# Patient Record
Sex: Male | Born: 1970 | Race: White | Hispanic: No | Marital: Married | Smoking: Current every day smoker
Health system: Southern US, Community
[De-identification: ages and names within clinical notes are randomized; demographics above are authoritative.]

## PROBLEM LIST (undated history)

## (undated) DIAGNOSIS — K219 Gastro-esophageal reflux disease without esophagitis: Secondary | ICD-10-CM

## (undated) DIAGNOSIS — E785 Hyperlipidemia, unspecified: Secondary | ICD-10-CM

## (undated) DIAGNOSIS — G249 Dystonia, unspecified: Secondary | ICD-10-CM

## (undated) DIAGNOSIS — F32A Depression, unspecified: Secondary | ICD-10-CM

## (undated) DIAGNOSIS — E781 Pure hyperglyceridemia: Secondary | ICD-10-CM

## (undated) HISTORY — DX: Dystonia, unspecified: G24.9

## (undated) HISTORY — DX: Pure hyperglyceridemia: E78.1

## (undated) HISTORY — PX: OTHER SURGICAL HISTORY: SHX169

## (undated) HISTORY — DX: Gastro-esophageal reflux disease without esophagitis: K21.9

## (undated) HISTORY — DX: Hyperlipidemia, unspecified: E78.5

## (undated) HISTORY — DX: Depression, unspecified: F32.A

---

## 2001-03-17 ENCOUNTER — Ambulatory Visit (HOSPITAL_COMMUNITY): Admission: RE | Admit: 2001-03-17 | Discharge: 2001-03-17 | Payer: Self-pay | Admitting: Orthopaedic Surgery

## 2001-03-17 HISTORY — PX: KNEE CARTILAGE SURGERY: SHX688

## 2005-02-06 ENCOUNTER — Emergency Department (HOSPITAL_COMMUNITY): Admission: EM | Admit: 2005-02-06 | Discharge: 2005-02-07 | Payer: Self-pay | Admitting: Emergency Medicine

## 2005-05-20 ENCOUNTER — Ambulatory Visit: Payer: Self-pay | Admitting: Cardiology

## 2005-12-17 ENCOUNTER — Ambulatory Visit (HOSPITAL_BASED_OUTPATIENT_CLINIC_OR_DEPARTMENT_OTHER): Admission: RE | Admit: 2005-12-17 | Discharge: 2005-12-17 | Payer: Self-pay | Admitting: Orthopedic Surgery

## 2008-12-20 ENCOUNTER — Ambulatory Visit (HOSPITAL_BASED_OUTPATIENT_CLINIC_OR_DEPARTMENT_OTHER): Admission: RE | Admit: 2008-12-20 | Discharge: 2008-12-20 | Payer: Self-pay | Admitting: Orthopedic Surgery

## 2009-09-27 ENCOUNTER — Ambulatory Visit (HOSPITAL_BASED_OUTPATIENT_CLINIC_OR_DEPARTMENT_OTHER): Admission: RE | Admit: 2009-09-27 | Discharge: 2009-09-28 | Payer: Self-pay | Admitting: Orthopedic Surgery

## 2010-07-23 LAB — POCT HEMOGLOBIN-HEMACUE: Hemoglobin: 15.1 g/dL (ref 13.0–17.0)

## 2010-08-11 LAB — POCT HEMOGLOBIN-HEMACUE: Hemoglobin: 15.5 g/dL (ref 13.0–17.0)

## 2010-09-18 NOTE — Op Note (Signed)
NAME:  Jonathan Meyer, Jonathan Meyer             ACCOUNT NO.:  [id number]   MEDICAL RECORD NO.:  [id number]          PATIENT TYPE:  AMB   LOCATION:  DSC                          FACILITY:  [facility]   PHYSICIAN:  Cindee Salt, M.D.       DATE OF BIRTH:  [birth year]   DATE OF PROCEDURE:  12/20/2008  DATE OF DISCHARGE:                               OPERATIVE REPORT   PREOPERATIVE DIAGNOSIS:  Ulnocarpal abutment, right wrist.   POSTOPERATIVE DIAGNOSIS:  Ulnocarpal abutment, right wrist.   OPERATION:  Ulnar shortening osteotomy, right wrist.   SURGEON:  Cindee Salt, MD   ASSISTANTCarolyne Fiscal, RN   ANESTHESIA:  General with local infiltration.   ANESTHESIOLOGIST:  Burna Forts, MD   HISTORY:  The patient is a 40 year old male with a history of ulnocarpal  abutment.  He has undergone an arthroscopic Gibson Ramp arthroplasty, but  has had continued pain on the ulnar aspect of his wrist.  He has elected  to proceed with ulnar shortening osteotomy.  He is aware of risks and  complications including infection; recurrence of injury to arteries,  nerves, tendons; incomplete relief of symptoms; dystrophy, the  possibility of nonunion, a further surgery being necessary to afford  healing.  He is scheduled for ulnar shortening osteotomy with Infuse  bone graft.  In preoperative area, the patient is seen.  The extremity  marked by both the patient and surgeon.  Antibiotic given.  Questions  again encouraged and answered.   PROCEDURE:  The patient was brought to the operating room where a  general anesthetic was carried out without difficulty was prepped using  ChloraPrep, supine position, right arm free.  A 3-minute dry time was  allowed, a time-out taken confirming the patient procedure.  The arm was  then draped.  The limb was exsanguinated with an Esmarch bandage.  Tourniquet was placed on the high and the arm was inflated to 250 mmHg.  A longitudinal incision was made on the ulnar aspect of the wrist,  carried down through the subcutaneous tissue.  Bleeders were  electrocauterized.  The interval between the extensor carpi ulnaris and  extensor digiti quinti was opened followed down to the distal ulna.  With blunt and sharp dissection, bleeders were electrocauterized with  bipolar.  An incision was made in the periosteum.  The periosteum  elevated.  A 7-hole modular 2.4-mm plate was then selected.  This was  placed distally with two 10 and one 11-mm screws.  The screws were then  removed, the plate rotated.  The ulna marked with an osteotome for  rotational alignment and along with marks for the plate and marks for  the osteotomy site and an oblique osteotomy was made, this removed  approximately 4 mm of bone.  The plate was then rotated.  The osteotomy  was made with an oscillating saw taking care to avoid burning the bone  with constant irrigation.  The segment of bone was removed without  difficulties.  Retractors were placed protecting the muscle and ulnar  artery and nerve.  These were removed.  The plate was rotated and  reapplied.  A 62 K-wire was then placed in the proximal hole.  This  allowed the plate to be compressed with a bone clamp through most  proximal to the osteotomy site, was then drilled.  This was done with  1.8 mm drill.  A 12-mm screw was then inserted, this fixed the site of  it did not fully compress the osteotomy.  An oblique screw was then  passed, this measured 14 mm.  This was overdrilled on the proximal  cortex.  This was then inserted.  The most proximal screw was backed out  to relieve any tension on the plate and the osteotomy site fully  compressed.  The most proximal screw was then reinserted, tightened.  Two further screws were placed proximally both measuring 12 mm.  The  wound was copiously irrigated with saline.  X-rays confirmed positioning  of the osteotomy site with shortening of the ulna.  Full pronation and  supination was afforded to the  patient.  A 0.7 mL Infuse bone graft had  been soaked.  This was then placed dorsally and palmarly on the  osteotomy site.  The fascia was then closed with a running 2-0 Vicryl  suture, the subcutaneous tissue with interrupted 2-0 Vicryl and the skin  with interrupted 4-0 Vicryl Rapide sutures.  A sterile compressive  dressing and long-arm splint was applied.  The patient tolerated the  procedure well.  On deflation of the tourniquet, all fingers were  immediately pinked.  He was taken to the recovery room for observation  in satisfactory condition.  He will be discharged home to return to the  [facility] in 1 week on Percocet.  He will be admitted  for overnight stay, will be discharged if he is comfortable.  A local  infiltration was given with 0.25% Marcaine without epinephrine in the  wound, 6 mL was used.  He will return to the office in 1 week on  Percocet.           ______________________________  Cindee Salt, M.D.     GK/MEDQ  D:  12/20/2008  T:  12/20/2008  Job:  [id number]

## 2010-09-21 NOTE — H&P (Signed)
[facility]  Patient:    Jonathan Meyer, Jonathan Meyer Visit Number: [id number] MRN: [id number]          Service Type: Attending:  Darreld Mclean, M.D. Dictated by:   Darreld Mclean, M.D. Adm. Date:  03/09/01                           History and Physical  CHIEF COMPLAINT:  Right knee pain.  The patient is scheduled for surgery on November 12.  HISTORY OF PRESENT ILLNESS:  The patient is a 40 year old white male with pain and tenderness in his right knee.  It has been bothering him on and off for two years.  It got progressively worse, particularly around the first of the month.  He has had an MRI of the knee performed.  This was done on the 22nd as ordered by Dr. Johny Shears in [place].  The MRI showed a tear of the medial meniscus of the posterior horn and body of the medial meniscus.  There was also fluid within the knee.  Dr. Jefferey Pica sent the patient to the office.  I saw him the first time on the 28th.  Surgery was recommended.  Due to the patients work schedule and other concerns, surgery was set up for November 12 at [facility].  REVIEW OF SYSTEMS:  The patient has negative review of systems.  ALLERGIES:  Denies any allergies.  MEDICATIONS:  Currently on Vicodin and he also has a nebulizer for bronchitis.  SOCIAL HISTORY:  He smokes two packs of cigarettes per day.  Does not use alcoholic beverages.  He has a GED.  Never has had any surgery.  The patient is married and works at [employer].  FAMILY HISTORY:  Heart problems and cancer runs in the family.  He has had none of these.  PHYSICAL EXAMINATION:  VITAL SIGNS:  The patient is afebrile, pulse 64, respirations 16, blood pressure 118/86, height 5 feet 8-1/2 inches, weight 165.  HEENT:  Negative.  NECK:  Supple.  LUNGS:  Clear to percussion and auscultation.  HEART:  Regular rhythm without murmur heart.  ABDOMEN:  Soft, nontender without masses.  EXTREMITIES:  Right knee painful,  positive knee to McMurray.  Knee is stable. No effusion.  Range of motion is good.  Other extremities are normal.  CNS:  Intact.  IMPRESSION:  Torn medial meniscus, right knee.  PLAN:  Operative arthroscopy of the right knee.  The risks and _____ have been explained to the patient.  He appears to understand and agrees to them. Labs are pending. Dictated by:   Darreld Mclean, M.D. Attending:  Darreld Mclean, M.D. DD:  03/09/01 TD:  03/09/01 Job: [id number]

## 2010-09-21 NOTE — Op Note (Signed)
[facility]  Patient:    Jonathan Meyer, Jonathan Meyer Visit Number: [id number] MRN: [id number]          Service Type: DSU Location: DAY Attending Physician:  Windle Guard Dictated by:   Darreld Mclean, M.D. Proc. Date: 03/17/01 Admit Date:  03/17/2001                             Operative Report  PREOPERATIVE DIAGNOSIS:  Tear of medial meniscus, right knee.  POSTOPERATIVE DIAGNOSIS:  Tear of medial meniscus, right knee.  OPERATION:  Operative arthroscopy with partial medial meniscectomy, right knee.  SURGEON:  Darreld Mclean, M.D.  ASSISTANT:  Candace Cruise, P.A.-C.  ANESTHESIA:  General.  TOURNIQUET TIME:  25 minutes.  DRAINS:  None.  INDICATIONS:  A 40 year old white male who complains of tenderness in his right knee.  MRI shows tear of medial meniscus.  Conservative treatment has not been successful.  Risks and imponderables were discussed with the patient preoperatively, and he appears to understand and agreed to the procedure as outlined.  FINDINGS:  Suprapatellar pouch was normal, patella normal, medially surfaces looked normal, posterior horn and medial meniscus.  Anterior cruciate was intact.  Laterally, articular surfaces were normal.  Lateral meniscus was normal.  No loose bodies.  DESCRIPTION OF PROCEDURE:  The patient was given general anesthesia and placed supine on the operating room table.  Tourniquet and leg holder placed, deflated right upper thigh.  The patient was prepped and draped in the usual manner.  The leg was systematically prepared.  First a in-flow cannula was inserted medially.  Using infusion pump, lactated Ringers were instilled into the knee.  All scopes were inserted laterally.  Knee was systematically e examined.  Please see Findings above.  Permanent pictures were taken throughout the case.  Tear of the medial meniscus was identified, and using a meniscectomy shaver and a punch, this part of the meniscus was  removed.  The knee was rather tight.  We were able to remove a good portion of the meniscus that was torn and made a very smooth contour of the remaining portion.  The knee was systematically re-examined, and no pathology was found.  The wounds were reapproximated using 3-0 nylon in interrupted vertical mattress manner. Marcaine 0.25% instilled in each portal.  Tourniquet deflated after 25 minutes.  Sterile dressing applied.  Bulky dressing applied.  Sponge and needle count was correct.  Tolerated procedure well and went to recovery in good condition.  Appropriate analgesia given for pain.  I will see him in the office in approximately 10 days to two weeks.  Physical therapy has been arranged.  For any difficulties, contact me through the office or hospital beeper system. Dictated by:   Darreld Mclean, M.D. Attending Physician:  Windle Guard DD:  03/17/01 TD:  03/17/01 Job: [id number]

## 2010-09-21 NOTE — Op Note (Signed)
NAME:  Jonathan, Meyer NO.:  [id number]   MEDICAL RECORD NO.:  [id number]          PATIENT TYPE:  AMB   LOCATION:  DSC                          FACILITY:  [facility]   PHYSICIAN:  Cindee Salt, M.D.       DATE OF BIRTH:  07-30-1970   DATE OF PROCEDURE:  12/17/2005  DATE OF DISCHARGE:                                 OPERATIVE REPORT   PREOPERATIVE DIAGNOSIS:  Triangle fibrocartilage complex tear right wrist.   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   OPERATION:  Arthroscopy right wrist, debridement of triangle fibrocartilage  complex and lunotriquetral tear, and Felden arthroplasty, right wrist.   SURGEON:  Cindee Salt, M.D.   ASSISTANT:  Carolyne Fiscal R.N.   ANESTHESIA:  General.   HISTORY:  The patient is a 40 year old male with a history of ulnar sided  wrist pain following injury.  He has had an MRI done revealing a tear of the  triangle fibrocartilage complex.  He has elected to proceed with arthroscopy  debridement, possible Felden arthroplasty in that he shows a 1 mm on the  plus.  In the preoperative area, questions were encouraged and answered, the  surgery discussed, the patient marked the forearm along with the surgeon.   PROCEDURE:  The patient was brought to the operating room where general  anesthetic was carried out without difficulty.  He was prepped using  DuraPrep, supine position, right arm free.  After a three minute dry time,  he was draped.  The limb was placed in the arthroscopy tower and 10 pounds  traction applied.  The joint was inflated at the 3/4 portal. A portal was  opened, transverse incision through skin only, deepened with a hemostat,  blunt trocar was used enter the joint.  The EPL was protected. The joint was  inspected.  The volar ligaments were intact on the radial side.  The  scapholunate ligament was intact.  There were no articular changes across  the distal radius.  Instability was noted to the lunotriquetral joint.  A  triangle fibrocartilage dual tear was noted, one in the A1A position, one  parallel to this closer to the fovea. The irrigation catheter was placed in  6/2 with an 18 gauge needle.  A 6R portal was opened after localization with  a 22 gauge needle and a probe was introduced.  The tear was identified,  changes were present on the distal lunate. The head of the ulnar showed some  chondromalacic changes.  The scope was introduced into the 6/2 portal and  inspection performed.  A large flap was noted.  A lunotriquetral tear was  also noted.  The mid carpal joint was then inspected after opening a radial  mid carpal portal after localization with a 22 gauge needle. STT joint  showed no changes.  There were no articular changes along the capitate,  scaphoid lunate, no instability of the right scapholunate ligament,  instability was noted to the lunotriquetral joint, type 2 lunate was  present.  There were minimal changes of proximal aspect of the hamate.  The  scope was then reintroduced into the 3/4 portal.  An ArthroWand was  introduced into the 6R portal and a debridement of the triangle  fibrocartilage was then performed. The tear was eliminated and removed with  forceps. At this point in time, with some probing, a large flap of the  dorsal portion of the lunotriquetral joint ligament was noted and this was  noted to be an entire avulsion between the lunate triquetrum and the entire  ligament could be brought down into the joint.  This was then debrided with  the ArthroWand and with a suction punch along with a full radius shaver.  This eliminated both the entire free area. The synovitis was then removed  with a ArthroWand.  The ArthroWand was used while making certain that  adequate flow was present the entire time without any significant heating of  the outflow. A Gator shaver was then introduced and the cartilage removed  from the  distal ulna.  A bur was then introduced and the distal end of the  ulna was removed.  X-rays confirmed adequate removal to stabilize the distal  radioulnar joint without any further impingement. The instruments were  removed.  The portals were closed with interrupted 5-0 nylon sutures.  Sterile compressive dressing and dorsal palmar splint were applied to the  wrist.  The patient tolerated the procedure well and was taken to the  recovery room for observation in satisfactory condition.  He is discharged  home to return to the [facility] in one week on Vicodin.           ______________________________  Cindee Salt, M.D.     GK/MEDQ  D:  12/17/2005  T:  12/17/2005  Job:  [id number]

## 2010-10-05 ENCOUNTER — Ambulatory Visit (HOSPITAL_BASED_OUTPATIENT_CLINIC_OR_DEPARTMENT_OTHER)
Admission: RE | Admit: 2010-10-05 | Payer: Worker's Compensation | Source: Ambulatory Visit | Admitting: Orthopedic Surgery

## 2011-12-23 ENCOUNTER — Other Ambulatory Visit: Payer: Self-pay | Admitting: Occupational Medicine

## 2011-12-23 ENCOUNTER — Ambulatory Visit: Payer: Self-pay

## 2011-12-23 DIAGNOSIS — Z021 Encounter for pre-employment examination: Secondary | ICD-10-CM

## 2013-12-23 ENCOUNTER — Encounter: Payer: Self-pay | Admitting: Cardiology

## 2013-12-23 ENCOUNTER — Telehealth: Payer: Self-pay | Admitting: Cardiology

## 2013-12-23 ENCOUNTER — Encounter: Payer: Self-pay | Admitting: *Deleted

## 2013-12-23 ENCOUNTER — Ambulatory Visit (INDEPENDENT_AMBULATORY_CARE_PROVIDER_SITE_OTHER): Payer: BC Managed Care – PPO | Admitting: Cardiology

## 2013-12-23 VITALS — BP 118/84 | HR 69 | Ht 71.0 in | Wt 178.0 lb

## 2013-12-23 DIAGNOSIS — R0789 Other chest pain: Secondary | ICD-10-CM

## 2013-12-23 DIAGNOSIS — F17200 Nicotine dependence, unspecified, uncomplicated: Secondary | ICD-10-CM

## 2013-12-23 DIAGNOSIS — Z720 Tobacco use: Secondary | ICD-10-CM

## 2013-12-23 DIAGNOSIS — R079 Chest pain, unspecified: Secondary | ICD-10-CM

## 2013-12-23 MED ORDER — ASPIRIN EC 81 MG PO TBEC: 81.0000 mg | DELAYED_RELEASE_TABLET | Freq: Every day | ORAL | Status: DC

## 2013-12-23 MED ORDER — NITROGLYCERIN 0.4 MG SL SUBL: 0.4000 mg | SUBLINGUAL_TABLET | SUBLINGUAL | Status: DC | PRN

## 2013-12-23 NOTE — Patient Instructions (Signed)
   Begin Aspirin 81mg  daily  Begin Nitroglycerin as needed for severe chest pain only - new sent to pharm Continue all other medications.   Your physician has requested that you have a lexiscan myoview. For further information please visit [email or web address]. Please follow instruction sheet, as given. Office will contact with results via phone or letter.   Follow up pending test results

## 2013-12-23 NOTE — Progress Notes (Signed)
     Clinical Summary Jonathan Meyer is a 43 y.o.male seen today as a new patient for the following medical problems.   1. Chest pain - last Thursday episode while laying in bed. Stabbing like pain left chest, 9/10. Felt lightheaded, +SOB. Lasted approx 1 minute. Not positional, though maybe slightly worst with taking deep breath. Reports repeat episode yesterday while driving. Similar pain, but also with some numbness in left arm. This episode lasted < 1 min. No relation to eating - overall has been going on and off last few months. Increase in frequency, mild increase in severity. Occurs every few days - has noted increased DOE over the same time period. No LE, no orthopnea.   CAD: +tobacco, maternal uncle CABG in 5650s, maternal grandfather MI 2060s   Denies PMH  Allergies  Allergen Reactions  . Bee Venom Swelling     Current Outpatient Prescriptions  Medication Sig Dispense Refill  . ibuprofen (ADVIL,MOTRIN) 200 MG tablet Take 200 mg by mouth as needed for headache (migraines).       No current facility-administered medications for this visit.     No past surgical history on file.   Allergies  Allergen Reactions  . Bee Venom Swelling      No family history on file.   Social History Jonathan Meyer reports that he has been smoking Cigarettes.  He has been smoking about 0.50 packs per day. He does not have any smokeless tobacco history on file. Jonathan Meyer reports that he does not drink alcohol.   Review of Systems CONSTITUTIONAL: No weight loss, fever, chills, weakness or fatigue.  HEENT: Eyes: No visual loss, blurred vision, double vision or yellow sclerae.No hearing loss, sneezing, congestion, runny nose or sore throat.  SKIN: No rash or itching.  CARDIOVASCULAR: per HPI RESPIRATORY: No shortness of breath, cough or sputum.  GASTROINTESTINAL: No anorexia, nausea, vomiting or diarrhea. No abdominal pain or blood.  GENITOURINARY: No burning on urination, no  polyuria NEUROLOGICAL: No headache, dizziness, syncope, paralysis, ataxia, numbness or tingling in the extremities. No change in bowel or bladder control.  MUSCULOSKELETAL: No muscle, back pain, joint pain or stiffness.  LYMPHATICS: No enlarged nodes. No history of splenectomy.  PSYCHIATRIC: No history of depression or anxiety.  ENDOCRINOLOGIC: No reports of sweating, cold or heat intolerance. No polyuria or polydipsia.     Physical Examination Filed Vitals:   12/23/13 0932  BP: 118/84  Pulse: 69   Filed Weights   12/23/13 0932  Weight: 178 lb (80.74 kg)    Gen: resting comfortably, no acute distress HEENT: no scleral icterus, pupils equal round and reactive, no palptable cervical adenopathy,  CV Resp: Clear to auscultation bilaterally GI: abdomen is soft, non-tender, non-distended, normal bowel sounds, no hepatosplenomegaly MSK: extremities are warm, no edema.  Skin: warm, no rash Neuro:  no focal deficits Psych: appropriate affect   Diagnostic Studies EKG: sinus,RAD, no ischemic changes    Assessment and Plan  1. Chest pain - unclear etiology, mixed in characteristics - describes worsening DOE over the same time period.Symptoms are progressing in frequency and severity. Concern could be a cardiac cause - will obtain Lexiscan MPI, he is not able to run on treadmill due to chronic back pain - start ASA 81mg  daily, start SL NG prn for the time being   - f/u based on stress test results      Antoine PocheJonathan F. Dorice Stiggers, M.D., F.A.C.C.

## 2013-12-23 NOTE — Telephone Encounter (Signed)
lexiscan myoview for chest pain Aug 28th arrival at 8:00  Radiology

## 2013-12-31 ENCOUNTER — Encounter (HOSPITAL_COMMUNITY)
Admission: RE | Admit: 2013-12-31 | Discharge: 2013-12-31 | Disposition: A | Discharge status: Home / Self Care | Payer: BC Managed Care – PPO | Source: Ambulatory Visit | Attending: Cardiology | Admitting: Cardiology

## 2013-12-31 ENCOUNTER — Encounter (HOSPITAL_COMMUNITY): Payer: Self-pay

## 2013-12-31 ENCOUNTER — Ambulatory Visit (HOSPITAL_COMMUNITY)
Admission: RE | Admit: 2013-12-31 | Discharge: 2013-12-31 | Disposition: A | Discharge status: Home / Self Care | Payer: BC Managed Care – PPO | Source: Ambulatory Visit | Attending: Cardiology | Admitting: Cardiology

## 2013-12-31 DIAGNOSIS — Z87891 Personal history of nicotine dependence: Secondary | ICD-10-CM | POA: Insufficient documentation

## 2013-12-31 DIAGNOSIS — R0602 Shortness of breath: Secondary | ICD-10-CM | POA: Insufficient documentation

## 2013-12-31 DIAGNOSIS — R079 Chest pain, unspecified: Secondary | ICD-10-CM

## 2013-12-31 IMAGING — NM NM MYOCAR MULTI W/SPECT W/WALL MOTION & EF
2 series · 12 of 12 positions shown · non-contrast
Comparison: None.

CLINICAL DATA: 43-year-old male with history of tobacco use who has
been experiencing chest pain and shortness of breath, referred for
an ischemic evaluation.

EXAM:
MYOCARDIAL IMAGING WITH SPECT (REST AND PHARMACOLOGIC-STRESS)
GATED LEFT VENTRICULAR WALL MOTION STUDY
LEFT VENTRICULAR EJECTION FRACTION
TECHNIQUE: Standard myocardial SPECT imaging was performed after resting
intravenous injection of 10 mCi [X5] sestamibi. Subsequently,
intravenous infusion of Lexiscan was performed under the supervision
of the Cardiology staff. At peak effect of the drug, 30 mCi [X5]
sestamibi was injected intravenously and standard myocardial SPECT
imaging was performed. Quantitative gated imaging was also performed
to evaluate left ventricular wall motion, and estimate left
ventricular ejection fraction.

[Series 1: rest · 8.28mm/px · 6 of 64 frames shown]
[frame 6/64]
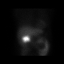
[frame 16/64]
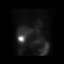
[frame 27/64]
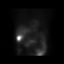
[frame 38/64]
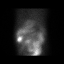
[frame 48/64]
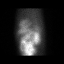
[frame 59/64]
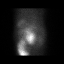

[Series 2: stress gated · 8.28mm/px · 6 of 64 frames shown]
[frame 6/64]
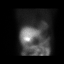
[frame 16/64]
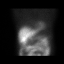
[frame 27/64]
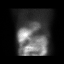
[frame 38/64]
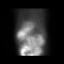
[frame 48/64]
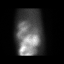
[frame 59/64]
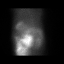

[12 of 12 positions shown; findings below may reference images not displayed]

FINDINGS: Stress/ECG data: The patient was stressed according to the Lexiscan
protocol. The heart rate ranged from 63 to 109 beats per min. The
blood pressure range from 99/72 up to 114/74. No chest pain was
reported.

Baseline ECG tracing demonstrated normal sinus rhythm. With
infusion, there were T-wave inversions in leads V1 through V4 with
T-wave flattening in V5 and V6. These normalized throughout the
infusion and into recovery. There were no ischemic ST segment
abnormalities, nor any arrhythmias.

Perfusion: No decreased activity in the left ventricle on stress
imaging to suggest reversible ischemia or infarction.

Wall Motion: Normal left ventricular wall motion. No left
ventricular dilation.

Left Ventricular Ejection Fraction: 65 %

End diastolic volume 109 ml

End systolic volume 37 ml
IMPRESSION: 1. No reversible ischemia or infarction.

2. Normal left ventricular wall motion.

3. Left ventricular ejection fraction 65%

4. Low-risk stress test findings*.

*[X5] Appropriate Use Criteria for Coronary Revascularization
Focused Update: J Am Coll Cardiol. [X5];59(9):857-881.
[URL]

## 2013-12-31 MED ORDER — REGADENOSON 0.4 MG/5ML IV SOLN
0.4000 mg | Freq: Once | INTRAVENOUS | Status: AC | PRN
  Administered 2013-12-31: 0.4 mg via INTRAVENOUS

## 2013-12-31 MED ORDER — SODIUM CHLORIDE 0.9 % IJ SOLN
INTRAMUSCULAR | Status: AC
  Administered 2013-12-31: 10 mL via INTRAVENOUS
  Filled 2013-12-31: qty 10

## 2013-12-31 MED ORDER — TECHNETIUM TC 99M SESTAMIBI GENERIC - CARDIOLITE
30.0000 | Freq: Once | INTRAVENOUS | Status: AC | PRN
  Administered 2013-12-31: 30 via INTRAVENOUS

## 2013-12-31 MED ORDER — REGADENOSON 0.4 MG/5ML IV SOLN
INTRAVENOUS | Status: AC
  Administered 2013-12-31: 0.4 mg via INTRAVENOUS
  Filled 2013-12-31: qty 5

## 2013-12-31 MED ORDER — SODIUM CHLORIDE 0.9 % IJ SOLN
10.0000 mL | INTRAMUSCULAR | Status: DC | PRN
  Administered 2013-12-31: 10 mL via INTRAVENOUS

## 2013-12-31 MED ORDER — TECHNETIUM TC 99M SESTAMIBI - CARDIOLITE
10.0000 | Freq: Once | INTRAVENOUS | Status: AC | PRN
  Administered 2013-12-31: 10 via INTRAVENOUS

## 2013-12-31 NOTE — Progress Notes (Signed)
Stress Lab Nurses Notes - [facility]  GAINES CARTMELL 12/31/2013 Reason for doing test: Chest Pain Type of test: Marlane Hatcher Nurse performing test: Parke Poisson, RN Nuclear Medicine Tech: Lyndel Pleasure Echo Tech: Not Applicable MD performing test: Koneswaran/K.Lyman Bishop NP Family MD: Margo Common Test explained and consent signed: Yes.   IV started: Saline lock flushed, No redness or edema and Saline lock started in radiology Symptoms: SOB & dizziness Treatment/Intervention: None Reason test stopped: protocol completed After recovery IV was: Discontinued via X-ray tech and No redness or edema Patient to return to Nuc. Med at : 11:00 Patient discharged: Home Patient's Condition upon discharge was: stable Comments: During test BP 112/72 & HR 109.  Recovery BP 97/74 & HR 80.  Symptoms resolved in recovery. Erskine Speed T

## 2014-01-04 ENCOUNTER — Encounter (HOSPITAL_COMMUNITY): Payer: Self-pay | Admitting: Emergency Medicine

## 2014-01-04 ENCOUNTER — Emergency Department (HOSPITAL_COMMUNITY): Payer: Worker's Compensation

## 2014-01-04 ENCOUNTER — Telehealth: Payer: Self-pay | Admitting: *Deleted

## 2014-01-04 ENCOUNTER — Emergency Department (HOSPITAL_COMMUNITY)
Admission: EM | Admit: 2014-01-04 | Discharge: 2014-01-04 | Disposition: A | Discharge status: Home / Self Care | Payer: Worker's Compensation | Attending: Emergency Medicine | Admitting: Emergency Medicine

## 2014-01-04 DIAGNOSIS — S139XXA Sprain of joints and ligaments of unspecified parts of neck, initial encounter: Secondary | ICD-10-CM | POA: Diagnosis not present

## 2014-01-04 DIAGNOSIS — Z791 Long term (current) use of non-steroidal anti-inflammatories (NSAID): Secondary | ICD-10-CM | POA: Diagnosis not present

## 2014-01-04 DIAGNOSIS — Y990 Civilian activity done for income or pay: Secondary | ICD-10-CM | POA: Insufficient documentation

## 2014-01-04 DIAGNOSIS — Y9289 Other specified places as the place of occurrence of the external cause: Secondary | ICD-10-CM | POA: Diagnosis not present

## 2014-01-04 DIAGNOSIS — Z7982 Long term (current) use of aspirin: Secondary | ICD-10-CM | POA: Insufficient documentation

## 2014-01-04 DIAGNOSIS — W208XXA Other cause of strike by thrown, projected or falling object, initial encounter: Secondary | ICD-10-CM | POA: Diagnosis not present

## 2014-01-04 DIAGNOSIS — Y9389 Activity, other specified: Secondary | ICD-10-CM | POA: Diagnosis not present

## 2014-01-04 DIAGNOSIS — S161XXA Strain of muscle, fascia and tendon at neck level, initial encounter: Secondary | ICD-10-CM

## 2014-01-04 DIAGNOSIS — S199XXA Unspecified injury of neck, initial encounter: Secondary | ICD-10-CM

## 2014-01-04 DIAGNOSIS — F17200 Nicotine dependence, unspecified, uncomplicated: Secondary | ICD-10-CM | POA: Diagnosis not present

## 2014-01-04 DIAGNOSIS — S0993XA Unspecified injury of face, initial encounter: Secondary | ICD-10-CM | POA: Insufficient documentation

## 2014-01-04 MED ORDER — CYCLOBENZAPRINE HCL 10 MG PO TABS
10.0000 mg | ORAL_TABLET | Freq: Once | ORAL | Status: AC
  Administered 2014-01-04: 10 mg via ORAL
  Filled 2014-01-04: qty 1

## 2014-01-04 NOTE — ED Provider Notes (Signed)
CSN: [id number]     Arrival date & time 01/04/14  1653 History   First MD Initiated Contact with Patient 01/04/14 1720     Chief Complaint  Patient presents with  . Neck Pain     (Consider location/radiation/quality/duration/timing/severity/associated sxs/prior Treatment) Patient is a 43 y.o. male presenting with neck injury. The history is provided by the patient.  Neck Injury This is a new problem. The current episode started yesterday. The problem occurs constantly. The problem has been gradually worsening. Exacerbated by: any movement of neck. He has tried nothing for the symptoms.   Jonathan Meyer is a 43 y.o.male who presents to the ED with neck pain. He was at work yesterday and some large boxes that weighed about 150 pounds fell on top of the patient's head. He was wearing his hard hat but it hit hard and pushed his neck down. He continued to work and tried to walk it off but last night he had a hard time sleeping and this morning the pain was worse. He went to work but his boss brought him to the ED for evaluation. Patient reports occasional tingling in fingers. He denies headache or LOC at the time of injury.   History reviewed. No pertinent past medical history. Past Surgical History  Procedure Laterality Date  . Arm surgery     History reviewed. No pertinent family history. History  Substance Use Topics  . Smoking status: Current Every Day Smoker -- 0.50 packs/day    Types: Cigarettes  . Smokeless tobacco: Not on file  . Alcohol Use: No    Review of Systems Negative except as stated in HPI   Allergies  Bee venom  Home Medications   Prior to Admission medications   Medication Sig Start Date End Date Taking? Authorizing Provider  aspirin EC 81 MG tablet Take 1 tablet (81 mg total) by mouth daily. 12/23/13   Antoine Poche, MD  ibuprofen (ADVIL,MOTRIN) 200 MG tablet Take 200 mg by mouth as needed for headache (migraines).    Historical Provider, MD    nitroGLYCERIN (NITROSTAT) 0.4 MG SL tablet Place 1 tablet (0.4 mg total) under the tongue every 5 (five) minutes as needed for chest pain. 12/23/13   Antoine Poche, MD   BP 111/83  Pulse 76  Temp(Src) 98.3 F (36.8 C) (Oral)  Resp 18  Ht  (1.803 m)  Wt 168 lb (76.204 kg)  BMI 23.44 kg/m2  SpO2 97% Physical Exam  Nursing note and vitals reviewed. Constitutional: He is oriented to person, place, and time. He appears well-developed and well-nourished.  HENT:  Head: Normocephalic.  Right Ear: Tympanic membrane normal.  Left Ear: Tympanic membrane normal.  Mouth/Throat: Uvula is midline, oropharynx is clear and moist and mucous membranes are normal.  Eyes: Conjunctivae and EOM are normal. Pupils are equal, round, and reactive to light.  Neck: Neck supple. Muscular tenderness present. Decreased range of motion (due to pain) present.  Muscle spasm  Cardiovascular: Normal rate and regular rhythm.   Pulmonary/Chest: Effort normal and breath sounds normal.  Abdominal: Soft. There is no tenderness.  Musculoskeletal:       Cervical back: He exhibits tenderness and spasm. Decreased range of motion: due to pain.       Back:  Neurological: He is alert and oriented to person, place, and time. He has normal strength. No cranial nerve deficit or sensory deficit. Gait normal.  Skin: Skin is warm and dry.  Psychiatric: He has a normal  mood and affect. His behavior is normal.    ED Course  Procedures (including critical care time) Labs Review Ct Cervical Spine Wo Contrast  01/04/2014   CLINICAL DATA:  Head trauma, neck pain.  EXAM: CT CERVICAL SPINE WITHOUT CONTRAST  TECHNIQUE: Multidetector CT imaging of the cervical spine was performed without intravenous contrast. Multiplanar CT image reconstructions were also generated.  COMPARISON:  None.  FINDINGS: Cervical vertebral bodies and posterior elements are intact and aligned with maintenance of the cervical lordosis. Mild C5-6 and C6-7  disc degeneration. No destructive bony lesions. C1-2 articulation maintained. Included prevertebral and paraspinal soft tissues are unremarkable. Trace paranasal sinus mucosal thickening partially imaged.  IMPRESSION: No acute fracture nor malalignment.   Electronically Signed   By: Awilda Metro   On: 01/04/2014 18:31    MDM  43 y.o. male with neck pain s/p injury yesterday. Offered the patient muscle relaxants and pain medication. He declines any Rx and states he will take ibuprofen and alternate heat and cold to his neck. I have reviewed this patient's vital signs, nurses notes, appropriate imaging and discussed findings with the patient.  He voices understanding. Stable for discharge without neuro deficits.     [facility] Orlene Och, [place] 01/04/14 [phone]

## 2014-01-04 NOTE — Telephone Encounter (Signed)
Message copied by Lesle Chris on Tue Jan 04, 2014 10:37 AM ------      Message from: [place] F      Created: Mon Jan 03, 2014  3:10 PM       Stress test is normal without any evidence of blockages. Would like to see him back in 2-3 weeks to discuss in detail, and follow up on symptoms                        Dominga Ferry MD ------

## 2014-01-04 NOTE — Telephone Encounter (Signed)
Notes Recorded by Lesle Chris, LPN on 4/0/9811 at 10:36 AM Left message to return call.

## 2014-01-04 NOTE — Discharge Instructions (Signed)

## 2014-01-04 NOTE — ED Notes (Addendum)
Pt had heavy supplies fall and strike him on the head yesterday, Had on hard hat, Pain in post neck since then  Alert, MAE  Had tingling  In legs since then.Placed in Phil collar

## 2014-01-04 NOTE — ED Provider Notes (Signed)
Medical screening examination/treatment/procedure(s) were performed by non-physician practitioner and as supervising physician I was immediately available for consultation/collaboration.   EKG Interpretation None      Adelaine Roppolo, MD, FACEP   Mandeep Ferch L Daelynn Blower, MD 01/04/14 2050 

## 2014-01-06 NOTE — Telephone Encounter (Signed)
Notes Recorded by Lesle Chris, LPN on 06/06/3084 at 10:27 AM Left message to return call.

## 2014-01-11 NOTE — Telephone Encounter (Signed)
Notes Recorded by Lesle Chris, LPN on 05/11/1094 at 10:48 AM Patient notified. Scheduled follow up for 02/08/2014 at 2:00 with Dr. Wyline Mood.

## 2014-02-08 ENCOUNTER — Ambulatory Visit: Payer: Self-pay | Admitting: Cardiology

## 2014-02-10 ENCOUNTER — Encounter: Payer: Self-pay | Admitting: Cardiology

## 2014-02-10 ENCOUNTER — Ambulatory Visit (INDEPENDENT_AMBULATORY_CARE_PROVIDER_SITE_OTHER): Payer: BC Managed Care – PPO | Admitting: Cardiology

## 2014-02-10 VITALS — BP 112/78 | HR 76 | Ht 71.0 in | Wt 220.8 lb

## 2014-02-10 DIAGNOSIS — E785 Hyperlipidemia, unspecified: Secondary | ICD-10-CM

## 2014-02-10 DIAGNOSIS — R079 Chest pain, unspecified: Secondary | ICD-10-CM

## 2014-02-10 NOTE — Progress Notes (Signed)
Clinical Summary Jonathan Meyer is a 43 y.o.male seen today for follow up of the following medical problems.   1. Chest pain  - reported episodes of chest pain at last visit - Since last visit completed exercise MPI that was negative for ischemia, LVEF 65% - since last visit denies any significant symptoms     Denies PMH   Allergies  Allergen Reactions  . Bee Venom Swelling     Current Outpatient Prescriptions  Medication Sig Dispense Refill  . ibuprofen (ADVIL,MOTRIN) 200 MG tablet Take 800 mg by mouth as needed for headache (migraines).        No current facility-administered medications for this visit.     Past Surgical History  Procedure Laterality Date  . Arm surgery       Allergies  Allergen Reactions  . Bee Venom Swelling      No family history on file.   Social History Jonathan Meyer reports that he has been smoking Cigarettes.  He has been smoking about 0.50 packs per day. He does not have any smokeless tobacco history on file. Jonathan Meyer reports that he does not drink alcohol.   Review of Systems CONSTITUTIONAL: No weight loss, fever, chills, weakness or fatigue.  HEENT: Eyes: No visual loss, blurred vision, double vision or yellow sclerae.No hearing loss, sneezing, congestion, runny nose or sore throat.  SKIN: No rash or itching.  CARDIOVASCULAR: per HPI RESPIRATORY: No shortness of breath, cough or sputum.  GASTROINTESTINAL: No anorexia, nausea, vomiting or diarrhea. No abdominal pain or blood.  GENITOURINARY: No burning on urination, no polyuria NEUROLOGICAL: No headache, dizziness, syncope, paralysis, ataxia, numbness or tingling in the extremities. No change in bowel or bladder control.  MUSCULOSKELETAL: No muscle, back pain, joint pain or stiffness.  LYMPHATICS: No enlarged nodes. No history of splenectomy.  PSYCHIATRIC: No history of depression or anxiety.  ENDOCRINOLOGIC: No reports of sweating, cold or heat intolerance. No polyuria or  polydipsia.  Marland Kitchen.   Physical Examination p 76 bp 112/78 Wt 220 lbs BMI 31 Gen: resting comfortably, no acute distress HEENT: no scleral icterus, pupils equal round and reactive, no palptable cervical adenopathy,  CV: RRR, no m/r/g, no JVD, no carotid bruits Resp: Clear to auscultation bilaterally GI: abdomen is soft, non-tender, non-distended, normal bowel sounds, no hepatosplenomegaly MSK: extremities are warm, no edema.  Skin: warm, no rash Neuro:  no focal deficits Psych: appropriate affect   Diagnostic Studies  12/2013 Lexiscan MPI FINDINGS:  Stress/ECG data: The patient was stressed according to the Lexiscan  protocol. The heart rate ranged from 63 to 109 beats per min. The  blood pressure range from 99/72 up to 114/74. No chest pain was  reported.  Baseline ECG tracing demonstrated normal sinus rhythm. With  infusion, there were T-wave inversions in leads V1 through V4 with  T-wave flattening in V5 and V6. These normalized throughout the  infusion and into recovery. There were no ischemic ST segment  abnormalities, nor any arrhythmias.  Perfusion: No decreased activity in the left ventricle on stress  imaging to suggest reversible ischemia or infarction.  Wall Motion: Normal left ventricular wall motion. No left  ventricular dilation.  Left Ventricular Ejection Fraction: 65 %  End diastolic volume 109 ml  End systolic volume 37 ml  IMPRESSION:  1. No reversible ischemia or infarction.  2. Normal left ventricular wall motion.  3. Left ventricular ejection fraction 65%  4. Low-risk stress test findings*.     Assessment and Plan  1. Chest pain  - negative stress test, does not appear to be cardicac in etiology. No recurrent symptoms since last visit - continue CAD risk factor modification, check HgbA1c and FLP   F/u 1 year       Antoine Poche, M.D

## 2014-02-10 NOTE — Patient Instructions (Signed)
There were no changes to your medications. Continue as directed. Labs for Fasting Lipid Panel, Hemoglobin A1C. Office will contact with results via phone or letter.   Your physician wants you to follow up in:  1 year.  You will receive a reminder letter in the mail one-two months in advance.  If you don't receive a letter, please call our office to schedule the follow up appointment.

## 2014-02-15 ENCOUNTER — Other Ambulatory Visit: Payer: Self-pay | Admitting: Cardiology

## 2014-02-15 LAB — LIPID PANEL
CHOLESTEROL: 291 mg/dL — AB (ref 0–200)
HDL: 25 mg/dL — ABNORMAL LOW (ref >39)
Total CHOL/HDL Ratio: 11.6 Ratio
Triglycerides: 1427 mg/dL — ABNORMAL HIGH (ref <150)

## 2014-02-15 LAB — HEMOGLOBIN A1C
Hgb A1c MFr Bld: 6.2 % — ABNORMAL HIGH (ref <5.7)
MEAN PLASMA GLUCOSE: 131 mg/dL — AB (ref <117)

## 2014-02-16 ENCOUNTER — Telehealth: Payer: Self-pay | Admitting: *Deleted

## 2014-02-16 NOTE — Telephone Encounter (Signed)
Message copied by Jerrye BeaversJONES, Daily Crate on Wed Feb 16, 2014 11:30 AM ------      Message from: Tolani LakeBRANCH, JONATHAN F      Created: Wed Feb 16, 2014 11:17 AM       Please varify with patient that his recent labs were fasting, please find out if he had anything to eat or drink before (incluind mild, coffee creamer etc.. His triglycerides are very elevated, this is a pattern we would see if he had anything to eat or drink before.            Dominga FerryJ Branch MD ------

## 2014-02-16 NOTE — Telephone Encounter (Signed)
Pt states that he did not have anything to eat or drink since the day before he had his blood work done.

## 2014-02-18 MED ORDER — ATORVASTATIN CALCIUM 40 MG PO TABS: 40.0000 mg | ORAL_TABLET | Freq: Every day | ORAL | Status: DC

## 2014-02-18 NOTE — Telephone Encounter (Signed)
Pt informed and rx sent to pharmacy. Results forwarded to PCP

## 2014-02-18 NOTE — Telephone Encounter (Signed)
Please let patient know that his cholesterol is too high. He needs to start atorvastatin 40mg  once daily. His labs also show that he has prediabetes, meaning early signs of diabetes that overtime may progress to full blown diabetes. To help prevent this and also improve his cholesterol he really needs to work on diet and exercise and weight loss. Please forward labs to pcp  Dominga FerryJ Branch MD

## 2014-08-22 ENCOUNTER — Other Ambulatory Visit (HOSPITAL_COMMUNITY): Payer: Self-pay | Admitting: Sports Medicine

## 2014-08-22 DIAGNOSIS — M25511 Pain in right shoulder: Secondary | ICD-10-CM

## 2014-09-06 ENCOUNTER — Ambulatory Visit (HOSPITAL_COMMUNITY)
Admission: RE | Admit: 2014-09-06 | Discharge: 2014-09-06 | Disposition: A | Discharge status: Home / Self Care | Payer: BLUE CROSS/BLUE SHIELD | Source: Ambulatory Visit | Attending: Sports Medicine | Admitting: Sports Medicine

## 2014-09-06 DIAGNOSIS — S43431A Superior glenoid labrum lesion of right shoulder, initial encounter: Secondary | ICD-10-CM | POA: Insufficient documentation

## 2014-09-06 DIAGNOSIS — M25511 Pain in right shoulder: Secondary | ICD-10-CM | POA: Insufficient documentation

## 2014-09-06 DIAGNOSIS — X58XXXA Exposure to other specified factors, initial encounter: Secondary | ICD-10-CM | POA: Diagnosis not present

## 2014-09-06 DIAGNOSIS — M6281 Muscle weakness (generalized): Secondary | ICD-10-CM | POA: Diagnosis not present

## 2014-09-06 MED ORDER — LIDOCAINE HCL (PF) 1 % IJ SOLN
INTRAMUSCULAR | Status: AC
  Filled 2014-09-06: qty 5

## 2014-09-06 MED ORDER — POVIDONE-IODINE 10 % EX SOLN
CUTANEOUS | Status: DC
Start: 2014-09-06 — End: 2014-09-07
  Filled 2014-09-06: qty 15

## 2014-09-06 MED ORDER — LIDOCAINE HCL (PF) 1 % IJ SOLN
INTRAMUSCULAR | Status: AC
  Filled 2014-09-06: qty 10

## 2014-09-06 MED ORDER — LIDOCAINE HCL (PF) 2 % IJ SOLN
INTRAMUSCULAR | Status: AC
  Filled 2014-09-06: qty 10

## 2014-09-07 MED ORDER — GADOBENATE DIMEGLUMINE 529 MG/ML IV SOLN: 5.0000 mL | Freq: Once | INTRAVENOUS | Status: AC | PRN

## 2014-09-07 MED ORDER — IOHEXOL 300 MG/ML  SOLN
10.0000 mL | Freq: Once | INTRAMUSCULAR | Status: AC | PRN
Start: 2014-09-06 — End: 2014-09-06

## 2015-02-09 ENCOUNTER — Emergency Department (HOSPITAL_COMMUNITY): Payer: BLUE CROSS/BLUE SHIELD

## 2015-02-09 ENCOUNTER — Encounter (HOSPITAL_COMMUNITY): Payer: Self-pay | Admitting: *Deleted

## 2015-02-09 ENCOUNTER — Emergency Department (HOSPITAL_COMMUNITY)
Admission: EM | Admit: 2015-02-09 | Discharge: 2015-02-09 | Disposition: A | Discharge status: Home / Self Care | Payer: BLUE CROSS/BLUE SHIELD | Attending: Emergency Medicine | Admitting: Emergency Medicine

## 2015-02-09 DIAGNOSIS — X58XXXA Exposure to other specified factors, initial encounter: Secondary | ICD-10-CM | POA: Diagnosis not present

## 2015-02-09 DIAGNOSIS — Z79899 Other long term (current) drug therapy: Secondary | ICD-10-CM | POA: Diagnosis not present

## 2015-02-09 DIAGNOSIS — M5441 Lumbago with sciatica, right side: Secondary | ICD-10-CM | POA: Insufficient documentation

## 2015-02-09 DIAGNOSIS — Y9289 Other specified places as the place of occurrence of the external cause: Secondary | ICD-10-CM | POA: Insufficient documentation

## 2015-02-09 DIAGNOSIS — Y9389 Activity, other specified: Secondary | ICD-10-CM | POA: Insufficient documentation

## 2015-02-09 DIAGNOSIS — S3992XA Unspecified injury of lower back, initial encounter: Secondary | ICD-10-CM | POA: Diagnosis present

## 2015-02-09 DIAGNOSIS — Z720 Tobacco use: Secondary | ICD-10-CM | POA: Insufficient documentation

## 2015-02-09 DIAGNOSIS — Y998 Other external cause status: Secondary | ICD-10-CM | POA: Insufficient documentation

## 2015-02-09 MED ORDER — OXYCODONE-ACETAMINOPHEN 5-325 MG PO TABS
1.0000 | ORAL_TABLET | Freq: Once | ORAL | Status: AC
  Administered 2015-02-09: 1 via ORAL
  Filled 2015-02-09: qty 1

## 2015-02-09 MED ORDER — OXYCODONE-ACETAMINOPHEN 5-325 MG PO TABS: 1.0000 | ORAL_TABLET | ORAL | Status: DC | PRN

## 2015-02-09 MED ORDER — METHOCARBAMOL 500 MG PO TABS: 500.0000 mg | ORAL_TABLET | Freq: Three times a day (TID) | ORAL | Status: DC

## 2015-02-09 MED ORDER — METHOCARBAMOL 500 MG PO TABS
500.0000 mg | ORAL_TABLET | Freq: Once | ORAL | Status: AC
  Administered 2015-02-09: 500 mg via ORAL
  Filled 2015-02-09: qty 1

## 2015-02-09 NOTE — ED Notes (Signed)
Pt states he twisted the wrong way to avoid falling on Tuesday. Hx of lower back pain in the past as well. Pt states he just left chiropractor's office and came here due to worsening pain.

## 2015-02-09 NOTE — ED Provider Notes (Signed)
CSN: [id number]     Arrival date & time 02/09/15  0850 History   First MD Initiated Contact with Patient 02/09/15 0859     No chief complaint on file.    (Consider location/radiation/quality/duration/timing/severity/associated sxs/prior Treatment) HPI   Jonathan Meyer is a 44 y.o. male who presents to the Emergency Department complaining of low back pain for two days.  He states he "twisted" his back trying to avoid a fall. Reports a hx of back pain and was seen by his chiropractor one day prior to arrival and again just prior to arrival.  He states the pain has been worsening and affected by movement.  Pain radiates into the right hip and upper leg.  He has taken muscle relaxers and ibuprofen without relief.  He denies abd pain, numbness or weakness of the lower extremities, urine or bowel changes, fever, chills.     No past medical history on file. Past Surgical History  Procedure Laterality Date  . Arm surgery     No family history on file. Social History  Substance Use Topics  . Smoking status: Current Every Day Smoker -- 0.50 packs/day    Types: Cigarettes  . Smokeless tobacco: Not on file  . Alcohol Use: No    Review of Systems  Constitutional: Negative for fever.  Respiratory: Negative for shortness of breath.   Gastrointestinal: Negative for vomiting, abdominal pain and constipation.  Genitourinary: Negative for dysuria, hematuria, flank pain, decreased urine volume and difficulty urinating.  Musculoskeletal: Positive for back pain. Negative for joint swelling.  Skin: Negative for rash.  Neurological: Negative for weakness and numbness.  All other systems reviewed and are negative.     Allergies  Bee venom  Home Medications   Prior to Admission medications   Medication Sig Start Date End Date Taking? Authorizing Provider  atorvastatin (LIPITOR) 40 MG tablet Take 1 tablet (40 mg total) by mouth daily. 02/18/14   Antoine Poche, MD  ibuprofen  (ADVIL,MOTRIN) 200 MG tablet Take 800 mg by mouth as needed for headache (migraines).     Historical Provider, MD   BP 115/77 mmHg  Pulse 68  Temp(Src) 97.9 F (36.6 C) (Oral)  Resp 18  Ht  (1.854 m)  Wt 182 lb (82.555 kg)  BMI 24.02 kg/m2  SpO2 97% Physical Exam  Constitutional: He is oriented to person, place, and time. He appears well-developed and well-nourished. No distress.  HENT:  Head: Normocephalic and atraumatic.  Neck: Normal range of motion. Neck supple.  Cardiovascular: Normal rate, regular rhythm, normal heart sounds and intact distal pulses.   No murmur heard. Pulmonary/Chest: Effort normal and breath sounds normal. No respiratory distress.  Abdominal: Soft. He exhibits no distension. There is no tenderness.  Musculoskeletal: He exhibits tenderness. He exhibits no edema.       Lumbar back: He exhibits tenderness and pain. He exhibits normal range of motion, no swelling, no deformity, no laceration and normal pulse.  Diffuse ttp of the  lumbar spine and bilateral paraspinal muscles.    DP pulses are brisk and symmetrical.  Distal sensation intact.  Hip Flexors/Extensors are intact.  Pt has 5/5 strength against resistance of bilateral lower extremities.     Neurological: He is alert and oriented to person, place, and time. He has normal strength. No sensory deficit. He exhibits normal muscle tone. Coordination and gait normal.  Reflex Scores:      Patellar reflexes are 2+ on the right side and 2+ on the left  side.      Achilles reflexes are 2+ on the right side and 2+ on the left side. Skin: Skin is warm and dry. No rash noted.  Nursing note and vitals reviewed.   ED Course  Procedures (including critical care time) Labs Review Labs Reviewed - No data to display  Imaging Review Dg Lumbar Spine Complete  02/09/2015   CLINICAL DATA:  Lumbago with right-sided radicular symptoms. Twisting injury 2 days prior  EXAM: LUMBAR SPINE - COMPLETE 4+ VIEW  COMPARISON:   None.  FINDINGS: Frontal, lateral, spot lumbosacral lateral, and bilateral oblique views were obtained. There are 5 non-rib-bearing lumbar type vertebral bodies. There is mild levoscoliosis. There is no fracture or spondylolisthesis. There is mild disc space narrowing at L4-5. Other disc spaces appear unremarkable. There is no appreciable facet arthropathy.  IMPRESSION: Relatively mild disc space narrowing at L4-5. Mild levoscoliosis. No fracture or spondylolisthesis.   Electronically Signed   By: Bretta Bang III M.D.   On: 02/09/2015 09:48   I have personally reviewed and evaluated these images and lab results as part of my medical decision-making.   EKG Interpretation None      MDM   Final diagnoses:  Bilateral low back pain with right-sided sciatica    Pt is feeling better after medications.  XR results discussed with patient.  He ambulates with a steady gait.  No concerning sx's for emergent neurological process.  i have advised to d/c chiropractor visits for now, will tx with robaxin, percocet, and will cont ibuprofen.  Appears stable for d/c    Pauline Aus, PA-C 02/10/15 2128  Lavera Guise, MD 02/13/15 1910

## 2015-03-01 ENCOUNTER — Encounter: Payer: Self-pay | Admitting: *Deleted

## 2015-03-03 ENCOUNTER — Ambulatory Visit (INDEPENDENT_AMBULATORY_CARE_PROVIDER_SITE_OTHER): Payer: BLUE CROSS/BLUE SHIELD | Admitting: Cardiology

## 2015-03-03 ENCOUNTER — Encounter: Payer: Self-pay | Admitting: Cardiology

## 2015-03-03 VITALS — BP 112/74 | HR 64 | Ht 71.0 in | Wt 182.0 lb

## 2015-03-03 DIAGNOSIS — Z136 Encounter for screening for cardiovascular disorders: Secondary | ICD-10-CM | POA: Diagnosis not present

## 2015-03-03 DIAGNOSIS — Z13220 Encounter for screening for lipoid disorders: Secondary | ICD-10-CM

## 2015-03-03 DIAGNOSIS — R7309 Other abnormal glucose: Secondary | ICD-10-CM

## 2015-03-03 DIAGNOSIS — Z130 Encounter for screening for diseases of the blood and blood-forming organs and certain disorders involving the immune mechanism: Secondary | ICD-10-CM | POA: Diagnosis not present

## 2015-03-03 DIAGNOSIS — E7800 Pure hypercholesterolemia, unspecified: Secondary | ICD-10-CM

## 2015-03-03 DIAGNOSIS — Z23 Encounter for immunization: Secondary | ICD-10-CM

## 2015-03-03 DIAGNOSIS — R0789 Other chest pain: Secondary | ICD-10-CM | POA: Diagnosis not present

## 2015-03-03 MED ORDER — NICOTINE 21 MG/24HR TD PT24: 21.0000 mg | MEDICATED_PATCH | Freq: Every day | TRANSDERMAL | Status: DC

## 2015-03-03 MED ORDER — NICOTINE 7 MG/24HR TD PT24: 7.0000 mg | MEDICATED_PATCH | Freq: Every day | TRANSDERMAL | Status: DC

## 2015-03-03 MED ORDER — NICOTINE 14 MG/24HR TD PT24: 14.0000 mg | MEDICATED_PATCH | Freq: Every day | TRANSDERMAL | Status: DC

## 2015-03-03 NOTE — Progress Notes (Signed)
Patient ID: TAESEAN RETH, male   DOB: April 27, 1971, 44 y.o.   MRN: [id number]     Clinical Summary Mr. Ruhland is a 44 y.o.male seen today for f/u of the following medical problems.   1. Chest pain  - previous episodes of chest pain - exercise MPI  was negative for ischemia, LVEF 65% - denies any recent symptoms. Does have some SOB at times. Longterm smoker, has not wanted PFTs    Past Medical History  Diagnosis Date  . Hyperlipidemia      Allergies  Allergen Reactions  . Bee Venom Swelling     Current Outpatient Prescriptions  Medication Sig Dispense Refill  . cyclobenzaprine (FLEXERIL) 10 MG tablet Take 10 mg by mouth 3 (three) times daily as needed for muscle spasms.    Marland Kitchen ibuprofen (ADVIL,MOTRIN) 200 MG tablet Take 800 mg by mouth as needed for headache (migraines).     . methocarbamol (ROBAXIN) 500 MG tablet Take 1 tablet (500 mg total) by mouth 3 (three) times daily. 30 tablet 0  . oxyCODONE-acetaminophen (PERCOCET/ROXICET) 5-325 MG tablet Take 1 tablet by mouth every 4 (four) hours as needed. 20 tablet 0  . [DISCONTINUED] atorvastatin (LIPITOR) 40 MG tablet Take 1 tablet (40 mg total) by mouth daily. (Patient not taking: Reported on 02/09/2015) 30 tablet 6   No current facility-administered medications for this visit.     Past Surgical History  Procedure Laterality Date  . Arm surgery    . Knee cartilage surgery Right 03/17/2001     Allergies  Allergen Reactions  . Bee Venom Swelling      Family History  Problem Relation Age of Onset  . Heart disease    . Cancer       Social History Mr. Winterhalter reports that he has been smoking Cigarettes.  He has been smoking about 0.50 packs per day. He does not have any smokeless tobacco history on file. Mr. Foutz reports that he does not drink alcohol.   Review of Systems CONSTITUTIONAL: No weight loss, fever, chills, weakness or fatigue.  HEENT: Eyes: No visual loss, blurred vision, double vision or  yellow sclerae.No hearing loss, sneezing, congestion, runny nose or sore throat.  SKIN: No rash or itching.  CARDIOVASCULAR: per hpi RESPIRATORY: No cough or sputum.  GASTROINTESTINAL: No anorexia, nausea, vomiting or diarrhea. No abdominal pain or blood.  GENITOURINARY: No burning on urination, no polyuria NEUROLOGICAL: No headache, dizziness, syncope, paralysis, ataxia, numbness or tingling in the extremities. No change in bowel or bladder control.  MUSCULOSKELETAL: No muscle, back pain, joint pain or stiffness.  LYMPHATICS: No enlarged nodes. No history of splenectomy.  PSYCHIATRIC: No history of depression or anxiety.  ENDOCRINOLOGIC: No reports of sweating, cold or heat intolerance. No polyuria or polydipsia.  Marland Kitchen   Physical Examination Filed Vitals:   03/03/15 1311  BP: 112/74  Pulse: 64   Filed Vitals:   03/03/15 1311  Height:  (1.803 m)  Weight: 182 lb (82.555 kg)    Gen: resting comfortably, no acute distress HEENT: no scleral icterus, pupils equal round and reactive, no palptable cervical adenopathy,  CV: RRR< no m/r/g, no jvd Resp: Clear to auscultation bilaterally GI: abdomen is soft, non-tender, non-distended, normal bowel sounds, no hepatosplenomegaly MSK: extremities are warm, no edema.  Skin: warm, no rash Neuro:  no focal deficits Psych: appropriate affect   Diagnostic Studies 12/2013 Lexiscan MPI FINDINGS:  Stress/ECG data: The patient was stressed according to the Lexiscan  protocol. The heart rate  ranged from 63 to 109 beats per min. The  blood pressure range from 99/72 up to 114/74. No chest pain was  reported.  Baseline ECG tracing demonstrated normal sinus rhythm. With  infusion, there were T-wave inversions in leads V1 through V4 with  T-wave flattening in V5 and V6. These normalized throughout the  infusion and into recovery. There were no ischemic ST segment  abnormalities, nor any arrhythmias.  Perfusion: No decreased activity  in the left ventricle on stress  imaging to suggest reversible ischemia or infarction.  Wall Motion: Normal left ventricular wall motion. No left  ventricular dilation.  Left Ventricular Ejection Fraction: 65 %  End diastolic volume 109 ml  End systolic volume 37 ml  IMPRESSION:  1. No reversible ischemia or infarction.  2. Normal left ventricular wall motion.  3. Left ventricular ejection fraction 65%  4. Low-risk stress test findings*.     Assessment and Plan    1. Chest pain  - negative stress test, does not appear to be cardicac in etiology. No recurrent symptoms since last visit - suspect SOB is related to his long term smoking. He is not interested in PFTs  2. Tobacco abuse - will give nicotine patches  F/u 1 year. Order annual labs.     Antoine PocheJonathan F. Ambry Dix, M.D.

## 2015-03-03 NOTE — Patient Instructions (Signed)
   Labs for CMET, CBC, Magnesium, TSH, HgA1C, FLP - orders given today. Office will contact with results via phone or letter.   Nicotine patch - sent to pharmacy today.   Continue all other medications.   Your physician wants you to follow up in:  1 year.  You will receive a reminder letter in the mail one-two months in advance.  If you don't receive a letter, please call our office to schedule the follow up appointment

## 2015-03-29 ENCOUNTER — Telehealth: Payer: Self-pay | Admitting: *Deleted

## 2015-03-29 NOTE — Telephone Encounter (Signed)
-----   Message from Antoine PocheJonathan F Branch, MD sent at 03/29/2015 10:53 AM EST ----- Labs show his cholesterol is too high and also he meets criteria for prediabetes. Please forward to his pcp. He needs to make a strong effort for weight loss, exercise, and diet changes otherwise he will need medicines for both diabetes and cholesterol  J BrancH MD

## 2015-03-29 NOTE — Telephone Encounter (Signed)
Patient informed and verbalized understanding of plan. Copy sent to PCP 

## 2016-10-03 ENCOUNTER — Ambulatory Visit (HOSPITAL_COMMUNITY)
Admission: RE | Admit: 2016-10-03 | Discharge: 2016-10-03 | Disposition: A | Discharge status: Home / Self Care | Payer: BLUE CROSS/BLUE SHIELD | Source: Ambulatory Visit | Attending: Cardiology | Admitting: Cardiology

## 2016-10-03 ENCOUNTER — Encounter: Payer: Self-pay | Admitting: *Deleted

## 2016-10-03 ENCOUNTER — Telehealth: Payer: Self-pay | Admitting: *Deleted

## 2016-10-03 ENCOUNTER — Encounter (HOSPITAL_COMMUNITY): Payer: Self-pay

## 2016-10-03 ENCOUNTER — Ambulatory Visit (INDEPENDENT_AMBULATORY_CARE_PROVIDER_SITE_OTHER): Payer: BLUE CROSS/BLUE SHIELD | Admitting: Cardiology

## 2016-10-03 VITALS — BP 108/74 | HR 65 | Ht 71.0 in | Wt 182.0 lb

## 2016-10-03 DIAGNOSIS — R079 Chest pain, unspecified: Secondary | ICD-10-CM

## 2016-10-03 DIAGNOSIS — R0789 Other chest pain: Secondary | ICD-10-CM | POA: Diagnosis not present

## 2016-10-03 MED ORDER — IOPAMIDOL (ISOVUE-370) INJECTION 76%
100.0000 mL | Freq: Once | INTRAVENOUS | Status: AC | PRN
Start: 2016-10-03 — End: 2016-10-03
  Administered 2016-10-03: 75 mL via INTRAVENOUS

## 2016-10-03 NOTE — Progress Notes (Signed)
Clinical Summary Mr. Bascom LevelsFrazier is a 46 y.o.male seen for follow up of the following medical problme.s   1. Chest pain - 1. Chest pain  - previous episodes of chest pain - exercise MPI  was negative for ischemia, LVEF 65%   - episode yesterday while driving. Was driving to restaurant. Sharp pain midchest, severely dizzyy. Turned into dull pain. Sharp pain was 6-7/10, +dizziness, tightness throughout chest. +SOB still going pain. Not positional. Pain lasted just a few seconds. Similar episode about 1 month ago, got diaphoretic. 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 Can have some nonspecific left leg pain.  - can have some DOE that is chronic - pain different from prior episodes.   - side effects on lexiscan.  Past Medical History:  Diagnosis Date  . Hyperlipidemia      Allergies  Allergen Reactions  . Bee Venom Swelling     Current Outpatient Prescriptions  Medication Sig Dispense Refill  . cyclobenzaprine (FLEXERIL) 10 MG tablet Take 10 mg by mouth 3 (three) times daily as needed for muscle spasms.    Marland Kitchen. ibuprofen (ADVIL,MOTRIN) 200 MG tablet Take 800 mg by mouth as needed for headache (migraines).     . nicotine (NICODERM CQ - DOSED IN MG/24 HOURS) 14 mg/24hr patch Place 1 patch (14 mg total) onto the skin daily. 14 patch 0  . nicotine (NICODERM CQ - DOSED IN MG/24 HOURS) 21 mg/24hr patch Place 1 patch (21 mg total) onto the skin daily. 28 patch 0  . nicotine (NICODERM CQ - DOSED IN MG/24 HR) 7 mg/24hr patch Place 1 patch (7 mg total) onto the skin daily. 14 patch 0  . oxyCODONE-acetaminophen (PERCOCET/ROXICET) 5-325 MG tablet Take 1 tablet by mouth every 4 (four) hours as needed. 20 tablet 0   No current facility-administered medications for this visit.      Past Surgical History:  Procedure Laterality Date  . arm surgery    . KNEE CARTILAGE SURGERY Right 03/17/2001     Allergies  Allergen Reactions  .  Bee Venom Swelling      Family History  Problem Relation Age of Onset  . Heart disease Unknown   . Cancer Unknown      Social History Mr. Bascom LevelsFrazier reports that he has been smoking Cigarettes.  He started smoking about 32 years ago. He has been smoking about 0.50 packs per day. He quit smokeless tobacco use about 30 years ago. His smokeless tobacco use included Chew. Mr. Bascom LevelsFrazier reports that he does not drink alcohol.   Review of Systems CONSTITUTIONAL: No weight loss, fever, chills, weakness or fatigue.  HEENT: Eyes: No visual loss, blurred vision, double vision or yellow sclerae.No hearing loss, sneezing, congestion, runny nose or sore throat.  SKIN: No rash or itching.  CARDIOVASCULAR: per hpi RESPIRATORY: per hpi.  GASTROINTESTINAL: No anorexia, nausea, vomiting or diarrhea. No abdominal pain or blood.  GENITOURINARY: No burning on urination, no polyuria NEUROLOGICAL: No headache, dizziness, syncope, paralysis, ataxia, numbness or tingling in the extremities. No change in bowel or bladder control.  MUSCULOSKELETAL: No muscle, back pain, joint pain or stiffness.  LYMPHATICS: No enlarged nodes. No history of splenectomy.  PSYCHIATRIC: No history of depression or anxiety.  ENDOCRINOLOGIC: No reports of sweating, cold or heat intolerance. No polyuria or polydipsia.  Marland Kitchen.   Physical Examination Vitals:   10/03/16 1300  BP: 108/74  Pulse: 65   Vitals:   10/03/16  1300  Weight: 182 lb (82.6 kg)  Height: 5\' 11"  (1.803 m)    Gen: resting comfortably, no acute distress HEENT: no scleral icterus, pupils equal round and reactive, no palptable cervical adenopathy,  CV: RRR, no m/r/g, no jvd Resp: Clear to auscultation bilaterally GI: abdomen is soft, non-tender, non-distended, normal bowel sounds, no hepatosplenomegaly MSK: extremities are warm, no edema.  Skin: warm, no rash Neuro:  no focal deficits Psych: appropriate affect   Diagnostic Studies 12/2013 Lexiscan  MPI FINDINGS:  Stress/ECG data: The patient was stressed according to the Lexiscan  protocol. The heart rate ranged from 63 to 109 beats per min. The  blood pressure range from 99/72 up to 114/74. No chest pain was  reported.  Baseline ECG tracing demonstrated normal sinus rhythm. With  infusion, there were T-wave inversions in leads V1 through V4 with  T-wave flattening in V5 and V6. These normalized throughout the  infusion and into recovery. There were no ischemic ST segment  abnormalities, nor any arrhythmias.  Perfusion: No decreased activity in the left ventricle on stress  imaging to suggest reversible ischemia or infarction.  Wall Motion: Normal left ventricular wall motion. No left  ventricular dilation.  Left Ventricular Ejection Fraction: 65 %  End diastolic volume 109 ml  End systolic volume 37 ml  IMPRESSION:  1. No reversible ischemia or infarction.  2. Normal left ventricular wall motion.  3. Left ventricular ejection fraction 65%  4. Low-risk stress test findings*.      Assessment and Plan  1. 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 He reports significant side effects with lexiscan before, unable to run on treadmill due to SOB and leg pains. We would pursue a DSE      Antoine Poche, M.D.

## 2016-10-03 NOTE — Telephone Encounter (Signed)
-----   Message from Antoine PocheJonathan F Branch, MD sent at 10/03/2016  3:00 PM EDT ----- Patient called by me and given results. We will plan for dobutamine stress echo. He reports significant side effects with lexiscan previously, unable to run on treadmill due to chronic SOB. Please arrange dobutamine stress echo for Monday, patient will need work not for Friday and Monday at time of his stress test on Monday.   Dominga FerryJ Branch MD

## 2016-10-03 NOTE — Telephone Encounter (Signed)
Pt says last night developed sharp chest pain/weakness/tightness/SOB - says CP lasted around 15 mins - SOB lasted for 2 hours. Pt denies active chest pain/dizziness/SOB today - says he is in route to work - pt is truck Hospital doctordriver - advised pt that he shouldn't be driving with any symptoms and if active chest pain returns [facility] - pt declined to go to ED at this time says since he isn't having any symptoms this morning. Pt confirmed he will see Dr Wyline MoodBranch [place] 1pm today [place].

## 2016-10-03 NOTE — Patient Instructions (Signed)
Get STAT Chest CT NOW, we will call you with results   Your best contact number is [phone]  Thank you for choosing [facility] !

## 2016-10-03 NOTE — Telephone Encounter (Signed)
Patient informed and instructions for dobutamine stress echo read to patient on the phone. Patient verbalized understanding.

## 2016-10-07 ENCOUNTER — Ambulatory Visit (HOSPITAL_COMMUNITY)
Admission: RE | Admit: 2016-10-07 | Discharge: 2016-10-07 | Disposition: A | Discharge status: Home / Self Care | Payer: BLUE CROSS/BLUE SHIELD | Source: Ambulatory Visit | Attending: Cardiology | Admitting: Cardiology

## 2016-10-07 DIAGNOSIS — E785 Hyperlipidemia, unspecified: Secondary | ICD-10-CM | POA: Diagnosis not present

## 2016-10-07 DIAGNOSIS — R079 Chest pain, unspecified: Secondary | ICD-10-CM | POA: Diagnosis not present

## 2016-10-07 DIAGNOSIS — Z720 Tobacco use: Secondary | ICD-10-CM | POA: Diagnosis not present

## 2016-10-07 LAB — ECHOCARDIOGRAM STRESS TEST
CSEPPHR: 162 {beats}/min
MPHR: 174 {beats}/min
Percent HR: 93 %
Rest HR: 69 {beats}/min

## 2016-10-07 MED ORDER — ATROPINE SULFATE 1 MG/ML IJ SOLN
INTRAMUSCULAR | Status: AC
  Administered 2016-10-07 (×2): 0.5 mg via INTRAVENOUS
  Filled 2016-10-07: qty 1

## 2016-10-07 MED ORDER — DOBUTAMINE IN D5W 4-5 MG/ML-% IV SOLN
INTRAVENOUS | Status: AC
  Administered 2016-10-07: 1000000 ug via INTRAVENOUS
  Filled 2016-10-07: qty 250

## 2016-10-07 MED ORDER — SODIUM CHLORIDE 0.9% FLUSH
INTRAVENOUS | Status: AC
  Administered 2016-10-07: 10 mL via INTRAVENOUS
  Filled 2016-10-07: qty 10

## 2016-10-07 NOTE — Progress Notes (Signed)
*  PRELIMINARY RESULTS* Echocardiogram Dobutamine stress has been performed.  Jeryl Columbialliott, Lowery Paullin 10/07/2016, 9:22 AM

## 2016-10-08 ENCOUNTER — Telehealth: Payer: Self-pay | Admitting: Cardiology

## 2016-10-08 NOTE — Telephone Encounter (Signed)
Results of stress echo/tg

## 2016-10-08 NOTE — Telephone Encounter (Signed)
Pt wants results of stress echo, I don't see it resulted yet

## 2016-10-08 NOTE — Telephone Encounter (Signed)
This is Dr. Verna CzechBranch's patient. The dobutamine echocardiogram was resulted yesterday and should already be in his inbox. Please check with Dr. Wyline MoodBranch.

## 2016-10-08 NOTE — Telephone Encounter (Signed)
Results given to pt,copied pcp 

## 2016-10-08 NOTE — Telephone Encounter (Signed)
Please see result note   J Murial Beam MD 

## 2020-02-18 ENCOUNTER — Ambulatory Visit: Payer: BC Managed Care – PPO | Admitting: Neurology

## 2020-04-19 ENCOUNTER — Encounter: Payer: Self-pay | Admitting: *Deleted

## 2020-04-21 ENCOUNTER — Ambulatory Visit: Payer: BC Managed Care – PPO | Admitting: Neurology

## 2020-04-21 ENCOUNTER — Encounter: Payer: Self-pay | Admitting: Neurology

## 2020-11-24 DIAGNOSIS — S134XXA Sprain of ligaments of cervical spine, initial encounter: Secondary | ICD-10-CM | POA: Diagnosis not present

## 2020-11-24 DIAGNOSIS — S233XXA Sprain of ligaments of thoracic spine, initial encounter: Secondary | ICD-10-CM | POA: Diagnosis not present

## 2020-11-24 DIAGNOSIS — S338XXA Sprain of other parts of lumbar spine and pelvis, initial encounter: Secondary | ICD-10-CM | POA: Diagnosis not present

## 2021-07-02 DIAGNOSIS — S233XXA Sprain of ligaments of thoracic spine, initial encounter: Secondary | ICD-10-CM | POA: Diagnosis not present

## 2021-07-02 DIAGNOSIS — S338XXA Sprain of other parts of lumbar spine and pelvis, initial encounter: Secondary | ICD-10-CM | POA: Diagnosis not present

## 2021-07-02 DIAGNOSIS — S134XXA Sprain of ligaments of cervical spine, initial encounter: Secondary | ICD-10-CM | POA: Diagnosis not present

## 2021-09-28 ENCOUNTER — Encounter: Payer: Self-pay | Admitting: Cardiology

## 2021-09-28 ENCOUNTER — Encounter: Payer: Self-pay | Admitting: *Deleted

## 2021-09-28 ENCOUNTER — Ambulatory Visit (INDEPENDENT_AMBULATORY_CARE_PROVIDER_SITE_OTHER): Payer: BC Managed Care – PPO | Admitting: Cardiology

## 2021-09-28 VITALS — BP 120/80 | HR 79 | Ht 71.0 in | Wt 172.2 lb

## 2021-09-28 DIAGNOSIS — R079 Chest pain, unspecified: Secondary | ICD-10-CM

## 2021-09-28 DIAGNOSIS — R002 Palpitations: Secondary | ICD-10-CM | POA: Diagnosis not present

## 2021-09-28 DIAGNOSIS — Z01812 Encounter for preprocedural laboratory examination: Secondary | ICD-10-CM

## 2021-09-28 DIAGNOSIS — R0789 Other chest pain: Secondary | ICD-10-CM

## 2021-09-28 MED ORDER — METOPROLOL TARTRATE 100 MG PO TABS: 100.0000 mg | ORAL_TABLET | Freq: Once | ORAL | 0 refills | Status: DC

## 2021-09-28 NOTE — Progress Notes (Signed)
Clinical Summary Jonathan Meyer is a 51 y.o.male seen today as a new patient, last seen in our office 09/2016   - 1. Chest pain   - previous episodes of chest pain - exercise MPI  was negative for ischemia, LVEF 65%     - episode yesterday while driving. Was driving to restaurant. Sharp pain midchest, severely dizzyy. Turned into dull pain. Sharp pain was 6-7/10, +dizziness, tightness throughout chest. +SOB still going pain. Not positional. Pain lasted just a few seconds. Similar episode about 1 month ago, got diaphoretic. 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 Uncle with history of blood clots. Can have some nonspecific left leg pain.  - can have some DOE that is chronic - pain different from prior episodes.    - he reports side effects on lexiscan.    2018 DSE no ischemia  - some recent chest pains - episdode 2.5-3 months ago. Was very stressed at that time.  - sharp pain left chest, coincided with heart beat. +SOB. Got lightheadeded. Heart was pounding, could heart beat. Clammy. Lasts < 10 seconds, suddenly resolved. Some dizziness afterward. 2 weeks later similar episode x 2 beats.  - coffee x 2 large cups, dr pepper 1/2 20 oz, no energy drinks, no EtoH - intermittent dull chest pains - some recent DOE  CAD risk factors: +tobacco 30 +years.      Past Medical History:  Diagnosis Date   Depression    Dyskinesia    GERD (gastroesophageal reflux disease)    Hyperlipidemia    Hypertriglyceridemia      Allergies  Allergen Reactions   Bee Venom Swelling     No current outpatient medications on file.   No current facility-administered medications for this visit.     Past Surgical History:  Procedure Laterality Date   arm surgery     KNEE CARTILAGE SURGERY Right 03/17/2001     Allergies  Allergen Reactions   Bee Venom Swelling      Family History  Problem Relation Age of Onset   CAD Other        uncle - died of PE   Heart  attack Other    Cancer Sister    Cancer Maternal Grandmother    Seizures Maternal Grandmother    Heart attack Maternal Grandfather      Social History Mr. Mehra reports that he has been smoking cigarettes. He started smoking about 37 years ago. He has been smoking an average of 1 pack per day. He quit smokeless tobacco use about 35 years ago.  His smokeless tobacco use included chew. Mr. Fausey reports no history of alcohol use.   Review of Systems CONSTITUTIONAL: No weight loss, fever, chills, weakness or fatigue.  HEENT: Eyes: No visual loss, blurred vision, double vision or yellow sclerae.No hearing loss, sneezing, congestion, runny nose or sore throat.  SKIN: No rash or itching.  CARDIOVASCULAR: per hpi RESPIRATORY: per hpi GASTROINTESTINAL: No anorexia, nausea, vomiting or diarrhea. No abdominal pain or blood.  GENITOURINARY: No burning on urination, no polyuria NEUROLOGICAL: No headache, dizziness, syncope, paralysis, ataxia, numbness or tingling in the extremities. No change in bowel or bladder control.  MUSCULOSKELETAL: No muscle, back pain, joint pain or stiffness.  LYMPHATICS: No enlarged nodes. No history of splenectomy.  PSYCHIATRIC: No history of depression or anxiety.  ENDOCRINOLOGIC: No reports of sweating, cold or heat intolerance. No polyuria or polydipsia.  Marland Kitchen   Physical Examination Today's Vitals  09/28/21 1445  BP: 120/80  Pulse: 79  SpO2: 96%  Weight: 172 lb 3.2 oz (78.1 kg)  Height: 5\' 11"  (1.803 m)   Body mass index is 24.02 kg/m.  Gen: resting comfortably, no acute distress HEENT: no scleral icterus, pupils equal round and reactive, no palptable cervical adenopathy,  CV: RRR, no m/r/ gno jvd Resp: Clear to auscultation bilaterally GI: abdomen is soft, non-tender, non-distended, normal bowel sounds, no hepatosplenomegaly MSK: extremities are warm, no edema.  Skin: warm, no rash Neuro:  no focal deficits Psych: appropriate  affect   Diagnostic Studies 12/2013 Lexiscan MPI FINDINGS:   Stress/ECG data: The patient was stressed according to the Lexiscan   protocol. The heart rate ranged from 63 to 109 beats per min. The   blood pressure range from 99/72 up to 114/74. No chest pain was   reported.   Baseline ECG tracing demonstrated normal sinus rhythm. With   infusion, there were T-wave inversions in leads V1 through V4 with   T-wave flattening in V5 and V6. These normalized throughout the   infusion and into recovery. There were no ischemic ST segment   abnormalities, nor any arrhythmias.   Perfusion: No decreased activity in the left ventricle on stress   imaging to suggest reversible ischemia or infarction.   Wall Motion: Normal left ventricular wall motion. No left   ventricular dilation.   Left Ventricular Ejection Fraction: 65 %   End diastolic volume 0000000 ml   End systolic volume 37 ml   IMPRESSION:   1. No reversible ischemia or infarction.   2. Normal left ventricular wall motion.   3. Left ventricular ejection fraction 65%   4. Low-risk stress test findings*.      Assessment and Plan  1. Chest pain/palpitations - unclear etiology of symptoms. Part sounds like possibly symptomatic arrhythmias, has has also had some exertioanl symptoms  - plan for 14 day zio patch, coronary CTA to further evalaute - EKG SR, nonspecific ST/T changes  F/u pending results.     Arnoldo Lenis, M.D

## 2021-09-28 NOTE — Patient Instructions (Addendum)
Medication Instructions:  Continue all current medications.  Labwork: BMET - order given today Please do just prior to CT at [facility]   Testing/Procedures: Your physician has recommended that you wear a 14 day event monitor. Event monitors are medical devices that record the heart's electrical activity. Doctors most often [place] these monitors to diagnose arrhythmias. Arrhythmias are problems with the speed or rhythm of the heartbeat. The monitor is a small, portable device. You can wear one while you do your normal daily activities. This is usually used to diagnose what is causing palpitations/syncope (passing out).  Coronary CTA  Office will contact with results via phone or letter.   Follow-Up:  Pending test results   Any Other Special Instructions Will Be Listed Below (If Applicable).   If you need a refill on your cardiac medications before your next appointment, please call your pharmacy.

## 2021-10-02 ENCOUNTER — Ambulatory Visit (INDEPENDENT_AMBULATORY_CARE_PROVIDER_SITE_OTHER): Payer: BC Managed Care – PPO

## 2021-10-02 ENCOUNTER — Other Ambulatory Visit: Payer: Self-pay | Admitting: Cardiology

## 2021-10-02 DIAGNOSIS — R002 Palpitations: Secondary | ICD-10-CM

## 2021-10-02 NOTE — Addendum Note (Signed)
Addended by: Lesle Chris on: 10/02/2021 09:46 AM   Modules accepted: Orders

## 2021-10-06 DIAGNOSIS — J209 Acute bronchitis, unspecified: Secondary | ICD-10-CM | POA: Diagnosis not present

## 2021-10-06 DIAGNOSIS — Z6824 Body mass index (BMI) 24.0-24.9, adult: Secondary | ICD-10-CM | POA: Diagnosis not present

## 2021-10-07 DIAGNOSIS — R002 Palpitations: Secondary | ICD-10-CM | POA: Diagnosis not present

## 2021-10-09 DIAGNOSIS — S233XXA Sprain of ligaments of thoracic spine, initial encounter: Secondary | ICD-10-CM | POA: Diagnosis not present

## 2021-10-09 DIAGNOSIS — S338XXA Sprain of other parts of lumbar spine and pelvis, initial encounter: Secondary | ICD-10-CM | POA: Diagnosis not present

## 2021-10-09 DIAGNOSIS — S134XXA Sprain of ligaments of cervical spine, initial encounter: Secondary | ICD-10-CM | POA: Diagnosis not present

## 2021-10-18 ENCOUNTER — Other Ambulatory Visit (HOSPITAL_COMMUNITY)
Admission: RE | Admit: 2021-10-18 | Discharge: 2021-10-18 | Disposition: A | Discharge status: Home / Self Care | Payer: BC Managed Care – PPO | Source: Ambulatory Visit | Attending: Cardiology | Admitting: Cardiology

## 2021-10-18 DIAGNOSIS — R0789 Other chest pain: Secondary | ICD-10-CM | POA: Insufficient documentation

## 2021-10-18 DIAGNOSIS — Z01812 Encounter for preprocedural laboratory examination: Secondary | ICD-10-CM

## 2021-10-18 LAB — BASIC METABOLIC PANEL
Anion gap: 8 (ref 5–15)
BUN: 14 mg/dL (ref 6–20)
CO2: 24 mmol/L (ref 22–32)
Calcium: 9.1 mg/dL (ref 8.9–10.3)
Chloride: 105 mmol/L (ref 98–111)
Creatinine, Ser: 0.98 mg/dL (ref 0.61–1.24)
GFR, Estimated: >60 mL/min (ref >60)
Glucose, Bld: 99 mg/dL (ref 70–99)
Potassium: 3.8 mmol/L (ref 3.5–5.1)
Sodium: 137 mmol/L (ref 135–145)

## 2021-10-19 ENCOUNTER — Telehealth (HOSPITAL_COMMUNITY): Payer: Self-pay | Admitting: *Deleted

## 2021-10-19 NOTE — Telephone Encounter (Signed)
Reaching out to patient to offer assistance regarding upcoming cardiac imaging study; pt verbalizes understanding of appt date/time, parking situation and where to check in, pre-test NPO status and medications ordered, and verified current allergies; name and call back number provided for further questions should they arise  Jacquelyn Antony RN Navigator Cardiac Imaging [facility] [phone] office [phone] cell  Patient to take 100mg metoprolol tartrate two hours prior to his cardiac CT scan.  He is aware to arrive at 12pm. 

## 2021-10-22 ENCOUNTER — Ambulatory Visit (HOSPITAL_COMMUNITY)
Admission: RE | Admit: 2021-10-22 | Discharge: 2021-10-22 | Disposition: A | Discharge status: Home / Self Care | Payer: BC Managed Care – PPO | Source: Ambulatory Visit | Attending: Cardiology | Admitting: Cardiology

## 2021-10-22 DIAGNOSIS — R0789 Other chest pain: Secondary | ICD-10-CM | POA: Insufficient documentation

## 2021-10-22 DIAGNOSIS — R079 Chest pain, unspecified: Secondary | ICD-10-CM | POA: Insufficient documentation

## 2021-10-22 MED ORDER — NITROGLYCERIN 0.4 MG SL SUBL
SUBLINGUAL_TABLET | SUBLINGUAL | Status: AC
  Filled 2021-10-22: qty 2

## 2021-10-22 MED ORDER — IOHEXOL 350 MG/ML SOLN
100.0000 mL | Freq: Once | INTRAVENOUS | Status: AC | PRN
  Administered 2021-10-22: 100 mL via INTRAVENOUS

## 2021-10-22 MED ORDER — NITROGLYCERIN 0.4 MG SL SUBL
0.8000 mg | SUBLINGUAL_TABLET | Freq: Once | SUBLINGUAL | Status: AC
Start: 2021-10-22 — End: 2021-10-22
  Administered 2021-10-22: 0.8 mg via SUBLINGUAL

## 2021-10-30 DIAGNOSIS — S233XXA Sprain of ligaments of thoracic spine, initial encounter: Secondary | ICD-10-CM | POA: Diagnosis not present

## 2021-10-30 DIAGNOSIS — S134XXA Sprain of ligaments of cervical spine, initial encounter: Secondary | ICD-10-CM | POA: Diagnosis not present

## 2021-10-30 DIAGNOSIS — S338XXA Sprain of other parts of lumbar spine and pelvis, initial encounter: Secondary | ICD-10-CM | POA: Diagnosis not present

## 2021-11-02 ENCOUNTER — Encounter: Payer: Self-pay | Admitting: *Deleted

## 2021-11-13 ENCOUNTER — Other Ambulatory Visit: Payer: Self-pay | Admitting: *Deleted

## 2021-11-13 ENCOUNTER — Encounter: Payer: Self-pay | Admitting: *Deleted

## 2021-11-13 MED ORDER — METOPROLOL TARTRATE 25 MG PO TABS: 12.5000 mg | ORAL_TABLET | Freq: Two times a day (BID) | ORAL | 6 refills | Status: DC

## 2022-01-14 DIAGNOSIS — R21 Rash and other nonspecific skin eruption: Secondary | ICD-10-CM | POA: Diagnosis not present

## 2022-03-15 ENCOUNTER — Ambulatory Visit: Payer: BC Managed Care – PPO | Admitting: Cardiology

## 2022-05-21 ENCOUNTER — Ambulatory Visit: Payer: BC Managed Care – PPO | Attending: Cardiology | Admitting: Medical

## 2022-05-21 ENCOUNTER — Encounter: Payer: Self-pay | Admitting: Medical

## 2022-05-21 VITALS — BP 136/100 | HR 86 | Ht 71.0 in | Wt 176.4 lb

## 2022-05-21 DIAGNOSIS — I25118 Atherosclerotic heart disease of native coronary artery with other forms of angina pectoris: Secondary | ICD-10-CM | POA: Diagnosis not present

## 2022-05-21 DIAGNOSIS — R079 Chest pain, unspecified: Secondary | ICD-10-CM

## 2022-05-21 NOTE — Patient Instructions (Signed)
Medication Instructions:  Your physician recommends that you continue on your current medications as directed. Please refer to the Current Medication list given to you today.   Labwork: CBC,BMET Lipids  Testing/Procedures: None today  Follow-Up: 1 year  Any Other Special Instructions Will Be Listed Below (If Applicable).  If you need a refill on your cardiac medications before your next appointment, please call your pharmacy.

## 2022-05-21 NOTE — Progress Notes (Signed)
Cardiology Office Note:    Date:  05/21/2022   ID:  Jonathan Meyer, DOB 21-Jan-1971, MRN [id number]  PCP:  Curlene Labrum, MD  [facility] Cardiologist:  None  [facility] Electrophysiologist:  None   Referring MD: Curlene Labrum, MD   Chief Complaint: 6 month follow-up  History of Present Illness:    Jonathan Meyer is a 52 y.o. male with a hx of CAD, tobacco use who presents for 6 months follow-up.   The patient has a history of CAD with chest pain. Prior exercise MPI was negative for ischemia, LVEF 65%.   Last seen 09/2021 reporting chest pain and palpitations. Heart monitor 10/2021 showed underlying NSR with average HR 78bpm, rare SVT, PACs/PVCs, no significant arrhythmias. Cardiac CTA 10/2021 showed mild non-calcified stenosis of the pRCA, coronary calcium score of 0, small L>R PFO suspected.   Today, the patient is overall doing well. Patient has very rare sharp chest pain, very brief. Also has occasional dizziness and lightheadedness. He has occasional palpitations, mostly when he is angry. BP a little high today. At home BP is good. Patient does no activity. 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   Past Medical History:  Diagnosis Date   Depression    Dyskinesia    GERD (gastroesophageal reflux disease)    Hyperlipidemia    Hypertriglyceridemia     Past Surgical History:  Procedure Laterality Date   arm surgery     KNEE CARTILAGE SURGERY Right 03/17/2001    Current Medications: No outpatient medications have been marked as taking for the 05/21/22 encounter (Office Visit) with Kathlen Mody, Margaretann Abate H, PA-C.     Allergies:   Bee venom   Social History   Socioeconomic History   Marital status: Married    Spouse name: Not on file   Number of children: Not on file   Years of education: Not on file   Highest education level: Not on file  Occupational History   Not on file  Tobacco Use   Smoking status: Every Day    Packs/day: 1.00    Types:  Cigarettes    Start date: 01/23/1984   Smokeless tobacco: Former    Types: Chew    Quit date: 01/22/1986  Vaping Use   Vaping Use: Never used  Substance and Sexual Activity   Alcohol use: No    Alcohol/week: 0.0 standard drinks of alcohol   Drug use: No   Sexual activity: Not on file  Other Topics Concern   Not on file  Social History Narrative   Not on file   Social Determinants of Health   Financial Resource Strain: Not on file  Food Insecurity: Not on file  Transportation Needs: Not on file  Physical Activity: Not on file  Stress: Not on file  Social Connections: Not on file     Family History: The patient's family history includes CAD in an other family member; Cancer in his maternal grandmother and sister; Heart attack in his maternal grandfather and another family member; Seizures in his maternal grandmother.  ROS:   Please see the history of present illness.     All other systems reviewed and are negative.  EKGs/Labs/Other Studies Reviewed:    The following studies were reviewed today:  Echo 2018 Study Conclusions   - Procedure narrative: Dobutamine stress test. Stress testing was    performed, with dobutamine infusion from 5 to 40 mcg/kg/min.    Atropine 0.5 mg x2 doses also  required to accelerate heart rate    to target. Heart rate response was augmented by the addition of    hand grips. The infusion was terminated due to target heart rate    achievement.  - Stress ECG conclusions: No diagnostic ST segment changes by    standard criteria to indicate ischemia. T-wave inversion improved    and rapid heart rate with return in recovery in the anterolateral    leads. Occasional PVCs were noted without sustained arrhythmia.    The stress ECG was negative for ischemia. No chest pain reported.  - Staged echo: There was no echocardiographic evidence for    stress-induced ischemia.   Heart monitor 10/2021  7 day monitor Rare supraventricular ectopy in the form of  isolated PACs Rare ventricular ectopy in the form of isolated PVCs No symptoms reported No significant arrhythmias     Patch Wear Time:  7 days and 21 hours (2023-06-04T16:30:59-0400 to 2023-06-12T14:04:53-0400)   Patient had a min HR of 45 bpm, max HR of 144 bpm, and avg HR of 78 bpm. Predominant underlying rhythm was Sinus Rhythm. Isolated SVEs were rare (<1.0%), and no SVE Couplets or SVE Triplets were present. Isolated VEs were rare (<1.0%), and no VE Couplets  or VE Triplets were present.  Cardiac CTA 10/2021  IMPRESSION: 1. Mild non-calcified stenosis of the proximal RCA, CADRADS = 2.   2. Coronary calcium score of 0. This was 0 percentile for age and sex matched control.   3. Normal coronary origin with right dominance.   4. Aggressive cardiovascular risk reduction recommended given non-calcified coronary plaque.   5. Small left to right PFO suspected   6. Consider non-coronary causes of chest pain   Electronically Signed: By: Chrystie Nose M.D. On: 10/22/2021 15:05    EKG:  EKG is not ordered today.    Recent Labs: 10/18/2021: BUN 14; Creatinine, Ser 0.98; Potassium 3.8; Sodium 137  Recent Lipid Panel    Component Value Date/Time   CHOL 291 (H) 02/15/2014 0830   TRIG 1,427 (H) 02/15/2014 0830   HDL 25 (L) 02/15/2014 0830   CHOLHDL 11.6 02/15/2014 0830   VLDL NOT CALC 02/15/2014 0830   LDLCALC NOT CALC 02/15/2014 0830     Physical Exam:    VS:  BP (!) 136/100   Pulse 86   Ht 5\' 11"  (1.803 m)   Wt 176 lb 6.4 oz (80 kg)   SpO2 96%   BMI 24.60 kg/m     Wt Readings from Last 3 Encounters:  05/21/22 176 lb 6.4 oz (80 kg)  09/28/21 172 lb 3.2 oz (78.1 kg)  10/03/16 182 lb (82.6 kg)     GEN:  Well nourished, well developed in no acute distress HEENT: Normal NECK: No JVD; No carotid bruits LYMPHATICS: No lymphadenopathy CARDIAC: RRR, no murmurs, rubs, gallops RESPIRATORY:  Clear to auscultation without rales, wheezing or rhonchi  ABDOMEN: Soft,  non-tender, non-distended MUSCULOSKELETAL:  No edema; No deformity  SKIN: Warm and dry NEUROLOGIC:  Alert and oriented x 3 PSYCHIATRIC:  Normal affect   ASSESSMENT:    1. Coronary artery disease involving native coronary artery of native heart with other form of angina pectoris (HCC)   2. Chest pain, unspecified type    PLAN:    In order of problems listed above:  Non-obstructive CAD Prior heart monitor and Cardiac CT in 2023 were unremarkable (calcium score of 0, mild non obstructive CAD). He reports very rare chest pain episodes. Palpitations mostly occur  when he is angry. 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 Diet is also poor and he is a smoker. Lifestyle changes dicussed in detail today. BP is mildly elevated, but at home he reports it's 120s/80s. He is not on BP meds. He will continue to check BP at home. I will get general labs today, CBC, BMET, lipid panel.   Disposition: Follow up in 6 month(s) with MD/APP    Signed, Bennie Chirico Ninfa Meeker, PA-C  05/21/2022 2:22 PM    [facility]

## 2022-08-02 ENCOUNTER — Encounter (HOSPITAL_COMMUNITY): Payer: Self-pay

## 2022-08-02 ENCOUNTER — Emergency Department (HOSPITAL_COMMUNITY): Payer: BC Managed Care – PPO

## 2022-08-02 ENCOUNTER — Emergency Department (HOSPITAL_COMMUNITY)
Admission: EM | Admit: 2022-08-02 | Discharge: 2022-08-02 | Disposition: A | Discharge status: Home / Self Care | Payer: BC Managed Care – PPO | Attending: Emergency Medicine | Admitting: Emergency Medicine

## 2022-08-02 ENCOUNTER — Other Ambulatory Visit: Payer: Self-pay

## 2022-08-02 DIAGNOSIS — R109 Unspecified abdominal pain: Secondary | ICD-10-CM | POA: Diagnosis not present

## 2022-08-02 DIAGNOSIS — N200 Calculus of kidney: Secondary | ICD-10-CM | POA: Insufficient documentation

## 2022-08-02 DIAGNOSIS — N3289 Other specified disorders of bladder: Secondary | ICD-10-CM | POA: Diagnosis not present

## 2022-08-02 LAB — CBC WITH DIFFERENTIAL/PLATELET
Abs Immature Granulocytes: 0.06 10*3/uL (ref 0.00–0.07)
Basophils Absolute: 0 10*3/uL (ref 0.0–0.1)
Basophils Relative: 0 %
Eosinophils Absolute: 0.1 10*3/uL (ref 0.0–0.5)
Eosinophils Relative: 1 %
HCT: 45.7 % (ref 39.0–52.0)
Hemoglobin: 14.8 g/dL (ref 13.0–17.0)
Immature Granulocytes: 1 %
Lymphocytes Relative: 12 %
Lymphs Abs: 1.3 10*3/uL (ref 0.7–4.0)
MCH: 28.7 pg (ref 26.0–34.0)
MCHC: 32.4 g/dL (ref 30.0–36.0)
MCV: 88.6 fL (ref 80.0–100.0)
Monocytes Absolute: 0.6 10*3/uL (ref 0.1–1.0)
Monocytes Relative: 5 %
Neutro Abs: 8.7 10*3/uL — ABNORMAL HIGH (ref 1.7–7.7)
Neutrophils Relative %: 81 %
Platelets: 217 10*3/uL (ref 150–400)
RBC: 5.16 MIL/uL (ref 4.22–5.81)
RDW: 13.8 % (ref 11.5–15.5)
WBC: 10.7 10*3/uL — ABNORMAL HIGH (ref 4.0–10.5)
nRBC: 0 % (ref 0.0–0.2)

## 2022-08-02 LAB — URINALYSIS, ROUTINE W REFLEX MICROSCOPIC
Glucose, UA: NEGATIVE mg/dL
Ketones, ur: NEGATIVE mg/dL
Leukocytes,Ua: NEGATIVE
Nitrite: NEGATIVE
Protein, ur: 100 mg/dL — AB
Specific Gravity, Urine: >1.03 — ABNORMAL HIGH (ref 1.005–1.030)
pH: 5.5 (ref 5.0–8.0)

## 2022-08-02 LAB — BASIC METABOLIC PANEL
Anion gap: 11 (ref 5–15)
BUN: 22 mg/dL — ABNORMAL HIGH (ref 6–20)
CO2: 24 mmol/L (ref 22–32)
Calcium: 9.1 mg/dL (ref 8.9–10.3)
Chloride: 102 mmol/L (ref 98–111)
Creatinine, Ser: 1.22 mg/dL (ref 0.61–1.24)
GFR, Estimated: >60 mL/min (ref >60)
Glucose, Bld: 144 mg/dL — ABNORMAL HIGH (ref 70–99)
Potassium: 3.7 mmol/L (ref 3.5–5.1)
Sodium: 137 mmol/L (ref 135–145)

## 2022-08-02 LAB — URINALYSIS, MICROSCOPIC (REFLEX)

## 2022-08-02 MED ORDER — KETOROLAC TROMETHAMINE 15 MG/ML IJ SOLN
15.0000 mg | Freq: Once | INTRAMUSCULAR | Status: AC
  Administered 2022-08-02: 15 mg via INTRAVENOUS
  Filled 2022-08-02: qty 1

## 2022-08-02 MED ORDER — SODIUM CHLORIDE 0.9 % IV BOLUS
1000.0000 mL | Freq: Once | INTRAVENOUS | Status: AC
  Administered 2022-08-02: 1000 mL via INTRAVENOUS

## 2022-08-02 MED ORDER — OXYCODONE-ACETAMINOPHEN 5-325 MG PO TABS: 1.0000 | ORAL_TABLET | Freq: Four times a day (QID) | ORAL | 0 refills | Status: AC | PRN

## 2022-08-02 MED ORDER — TAMSULOSIN HCL 0.4 MG PO CAPS
0.4000 mg | ORAL_CAPSULE | Freq: Once | ORAL | Status: AC
  Administered 2022-08-02: 0.4 mg via ORAL
  Filled 2022-08-02: qty 1

## 2022-08-02 MED ORDER — ONDANSETRON HCL 4 MG/2ML IJ SOLN
4.0000 mg | Freq: Once | INTRAMUSCULAR | Status: AC
  Administered 2022-08-02: 4 mg via INTRAVENOUS
  Filled 2022-08-02: qty 2

## 2022-08-02 MED ORDER — FENTANYL CITRATE PF 50 MCG/ML IJ SOSY
50.0000 ug | PREFILLED_SYRINGE | Freq: Once | INTRAMUSCULAR | Status: AC
  Administered 2022-08-02: 50 ug via INTRAMUSCULAR
  Filled 2022-08-02: qty 1

## 2022-08-02 MED ORDER — TAMSULOSIN HCL 0.4 MG PO CAPS: 0.4000 mg | ORAL_CAPSULE | Freq: Every day | ORAL | 0 refills | Status: AC

## 2022-08-02 MED ORDER — ONDANSETRON HCL 4 MG PO TABS: 4.0000 mg | ORAL_TABLET | Freq: Four times a day (QID) | ORAL | 0 refills | Status: AC

## 2022-08-02 MED ORDER — KETOROLAC TROMETHAMINE 30 MG/ML IJ SOLN
30.0000 mg | Freq: Once | INTRAMUSCULAR | Status: AC
  Administered 2022-08-02: 30 mg via INTRAVENOUS
  Filled 2022-08-02: qty 1

## 2022-08-02 NOTE — ED Provider Notes (Signed)
[place] EMERGENCY DEPARTMENT AT [facility] Provider Note   CSN: [id number] Arrival date & time: 08/02/22  1633     History  Chief Complaint  Patient presents with   Flank Pain    Jonathan Meyer is a 52 y.o. male.   Flank Pain     Patient presents due to right flank pain.  It started around 1 PM today, is been constant.  Associate with nausea and 3 episodes of emesis.  He has not noticed any dysuria, hematuria, fevers.  Does have a history of previous kidney stones that feels somewhat similar.  No chest pain or shortness of breath.  Home Medications Prior to Admission medications   Medication Sig Start Date End Date Taking? Authorizing Provider  ondansetron (ZOFRAN) 4 MG tablet Take 1 tablet (4 mg total) by mouth every 6 (six) hours. 08/02/22  Yes Theron Arista, PA-C  oxyCODONE-acetaminophen (PERCOCET/ROXICET) 5-325 MG tablet Take 1 tablet by mouth every 6 (six) hours as needed for severe pain. 08/02/22  Yes Theron Arista, PA-C  tamsulosin (FLOMAX) 0.4 MG CAPS capsule Take 1 capsule (0.4 mg total) by mouth daily after supper. 08/02/22  Yes Theron Arista, PA-C      Allergies    Bee venom    Review of Systems   Review of Systems  Genitourinary:  Positive for flank pain.    Physical Exam Updated Vital Signs BP 113/63 (BP Location: Right Arm)   Pulse 63   Temp 98 F (36.7 C) (Oral)   Resp 18   Ht 5\' 11"  (1.803 m)   Wt 78.5 kg   SpO2 95%   BMI 24.13 kg/m  Physical Exam Vitals and nursing note reviewed. Exam conducted with a chaperone present.  Constitutional:      Appearance: Normal appearance.  HENT:     Head: Normocephalic and atraumatic.  Eyes:     General: No scleral icterus.       Right eye: No discharge.        Left eye: No discharge.     Extraocular Movements: Extraocular movements intact.     Pupils: Pupils are equal, round, and reactive to light.  Cardiovascular:     Rate and Rhythm: Normal rate and regular rhythm.     Pulses: Normal  pulses.     Heart sounds: Normal heart sounds. No murmur heard.    No friction rub. No gallop.  Pulmonary:     Effort: Pulmonary effort is normal. No respiratory distress.     Breath sounds: Normal breath sounds.  Abdominal:     General: Abdomen is flat. Bowel sounds are normal. There is no distension.     Palpations: Abdomen is soft.     Tenderness: There is no abdominal tenderness. There is right CVA tenderness.  Skin:    General: Skin is warm and dry.     Coloration: Skin is not jaundiced.  Neurological:     Mental Status: He is alert. Mental status is at baseline.     Coordination: Coordination normal.     ED Results / Procedures / Treatments   Labs (all labs ordered are listed, but only abnormal results are displayed) Labs Reviewed  URINALYSIS, ROUTINE W REFLEX MICROSCOPIC - Abnormal; Notable for the following components:      Result Value   APPearance HAZY (*)    Specific Gravity, Urine >1.030 (*)    Hgb urine dipstick SMALL (*)    Bilirubin Urine SMALL (*)    Protein, ur 100 (*)  All other components within normal limits  BASIC METABOLIC PANEL - Abnormal; Notable for the following components:   Glucose, Bld 144 (*)    BUN 22 (*)    All other components within normal limits  CBC WITH DIFFERENTIAL/PLATELET - Abnormal; Notable for the following components:   WBC 10.7 (*)    Neutro Abs 8.7 (*)    All other components within normal limits  URINALYSIS, MICROSCOPIC (REFLEX) - Abnormal; Notable for the following components:   Bacteria, UA MANY (*)    All other components within normal limits    EKG None  Radiology CT Renal Stone Study  Result Date: 08/02/2022 CLINICAL DATA:  Right flank pain. EXAM: CT ABDOMEN AND PELVIS WITHOUT CONTRAST TECHNIQUE: Multidetector CT imaging of the abdomen and pelvis was performed following the standard protocol without IV contrast. RADIATION DOSE REDUCTION: This exam was performed according to the departmental dose-optimization  program which includes automated exposure control, adjustment of the mA and/or kV according to patient size and/or use of iterative reconstruction technique. COMPARISON:  None Available. FINDINGS: Lower chest: No acute abnormality. Hepatobiliary: No focal liver abnormality is seen. No gallstones, gallbladder wall thickening, or biliary dilatation. Pancreas: Unremarkable. No pancreatic ductal dilatation or surrounding inflammatory changes. Spleen: Normal in size without focal abnormality. Adrenals/Urinary Tract: Adrenal glands are unremarkable. Kidneys are normal in size, without focal lesions. A 3 mm obstructing renal calculus is seen at the right UVJ, with mild right-sided hydronephrosis, hydroureter and perinephric inflammatory fat stranding. The urinary bladder is poorly distended and subsequently limited in evaluation. Stomach/Bowel: There is a small hiatal hernia. Appendix appears normal. No evidence of bowel wall thickening, distention, or inflammatory changes. Vascular/Lymphatic: No significant vascular findings are present. No enlarged abdominal or pelvic lymph nodes. Reproductive: Prostate is unremarkable. Other: No abdominal wall hernia or abnormality. No abdominopelvic ascites. Musculoskeletal: No acute or significant osseous findings. IMPRESSION: 1. 3 mm obstructing renal calculus at the right UVJ. 2. Small hiatal hernia. Electronically Signed   By: Aram Candela M.D.   On: 08/02/2022 19:28    Procedures Procedures    Medications Ordered in ED Medications  fentaNYL (SUBLIMAZE) injection 50 mcg (50 mcg Intramuscular Given 08/02/22 1852)  ketorolac (TORADOL) 30 MG/ML injection 30 mg (30 mg Intravenous Given 08/02/22 2024)  ondansetron (ZOFRAN) injection 4 mg (4 mg Intravenous Given 08/02/22 2024)  sodium chloride 0.9 % bolus 1,000 mL (1,000 mLs Intravenous New Bag/Given 08/02/22 2024)  ketorolac (TORADOL) 15 MG/ML injection 15 mg (15 mg Intravenous Given 08/02/22 2209)  tamsulosin (FLOMAX)  capsule 0.4 mg (0.4 mg Oral Given 08/02/22 2209)    ED Course/ Medical Decision Making/ A&P                             Medical Decision Making Amount and/or Complexity of Data Reviewed Labs: ordered. Radiology: ordered.  Risk Prescription drug management.   Patient presents due to right flank pain.  Differential includes nephrolithiasis, UTI, pyelonephritis, septic stone, dehydration, AKI.    Will check labs, CT renal study.  Will also treat empirically with pain medicine, IV fluids, and antiemetics.  Clinically no SIRS criteria do not think her sepsis.  CBC with a slight white count, BMP without gross derangement or AKI.  UA is notable for trace hematuria bilirubinemia but no leukocyturia, do not suspect underlying UTI or infected stone.  Patient's pain is improved on reevaluation, considered admission but given improvement I think reasonable for outpatient trial with pain  medicine, antiemetics and urology follow-up.  Discussed with the patient and his family member who are in  agreement with the plan.           Final Clinical Impression(s) / ED Diagnoses Final diagnoses:  Nephrolithiasis    Rx / DC Orders ED Discharge Orders          Ordered    tamsulosin (FLOMAX) 0.4 MG CAPS capsule  Daily after supper        08/02/22 2224    ondansetron (ZOFRAN) 4 MG tablet  Every 6 hours        08/02/22 2224    oxyCODONE-acetaminophen (PERCOCET/ROXICET) 5-325 MG tablet  Every 6 hours PRN        08/02/22 2224              Theron Arista, PA-C 08/02/22 2227    Bethann Berkshire, MD 08/03/22 1112

## 2022-08-02 NOTE — Discharge Instructions (Addendum)
Take Flomax once in the evenings.  Take the Percocet as needed for severe pain, you can also take this in conjunction with Tylenol and Motrin.  Call and schedule appointment with urology for next week, information above.  Strict plenty of fluids, the stone should pass on its own.  Return to the ED for intractable nausea and vomiting, severe pain, new or concerning symptoms.

## 2022-08-02 NOTE — ED Triage Notes (Signed)
Right flank pain radiating to groin

## 2022-08-02 NOTE — ED Notes (Addendum)
Pt given urinal.

## 2023-05-15 ENCOUNTER — Encounter (INDEPENDENT_AMBULATORY_CARE_PROVIDER_SITE_OTHER): Payer: Self-pay | Admitting: Otolaryngology

## 2023-05-21 ENCOUNTER — Ambulatory Visit: Payer: BC Managed Care – PPO | Admitting: Cardiology

## 2023-06-13 ENCOUNTER — Ambulatory Visit (INDEPENDENT_AMBULATORY_CARE_PROVIDER_SITE_OTHER): Payer: BC Managed Care – PPO | Admitting: Otolaryngology

## 2023-06-13 ENCOUNTER — Encounter (INDEPENDENT_AMBULATORY_CARE_PROVIDER_SITE_OTHER): Payer: Self-pay | Admitting: Otolaryngology

## 2023-06-13 VITALS — BP 120/82 | HR 72 | Ht 71.0 in | Wt 163.0 lb

## 2023-06-13 DIAGNOSIS — J3089 Other allergic rhinitis: Secondary | ICD-10-CM

## 2023-06-13 DIAGNOSIS — F17210 Nicotine dependence, cigarettes, uncomplicated: Secondary | ICD-10-CM

## 2023-06-13 DIAGNOSIS — H9313 Tinnitus, bilateral: Secondary | ICD-10-CM

## 2023-06-13 DIAGNOSIS — J343 Hypertrophy of nasal turbinates: Secondary | ICD-10-CM

## 2023-06-13 DIAGNOSIS — J342 Deviated nasal septum: Secondary | ICD-10-CM | POA: Diagnosis not present

## 2023-06-13 DIAGNOSIS — R058 Other specified cough: Secondary | ICD-10-CM

## 2023-06-13 DIAGNOSIS — R490 Dysphonia: Secondary | ICD-10-CM

## 2023-06-13 DIAGNOSIS — K219 Gastro-esophageal reflux disease without esophagitis: Secondary | ICD-10-CM | POA: Diagnosis not present

## 2023-06-13 DIAGNOSIS — R0982 Postnasal drip: Secondary | ICD-10-CM

## 2023-06-13 DIAGNOSIS — Z720 Tobacco use: Secondary | ICD-10-CM

## 2023-06-13 DIAGNOSIS — R0981 Nasal congestion: Secondary | ICD-10-CM

## 2023-06-13 MED ORDER — FAMOTIDINE 20 MG PO TABS: 20.0000 mg | ORAL_TABLET | Freq: Two times a day (BID) | ORAL | 1 refills | Status: AC

## 2023-06-13 MED ORDER — CETIRIZINE HCL 10 MG PO TABS: 10.0000 mg | ORAL_TABLET | Freq: Every day | ORAL | 11 refills | Status: AC

## 2023-06-13 MED ORDER — FLUTICASONE PROPIONATE 50 MCG/ACT NA SUSP: 2.0000 | Freq: Two times a day (BID) | NASAL | 6 refills | Status: AC

## 2023-06-13 NOTE — Progress Notes (Signed)
 ENT CONSULT:  Reason for Consult: dysphonia x for several months and bilateral ringing in ears   HPI: Discussed the use of AI scribe software for clinical note transcription with the patient, who gave verbal consent to proceed.  History of Present Illness   Jonathan Meyer is a 53 year old male who presents with persistent hoarseness and ear ringing, bilateral but worse on the right side both present for several months.  He has experienced persistent hoarseness since November, 2024 following a cold, with symptoms lasting over two months. During this time, his voice was almost completely gone. Although his voice has returned, it becomes crackling with extended talking and raspy. No pain when speaking, but he occasionally has difficulty swallowing, particularly with bread. He has a history of reflux, for which he takes chewable Tums as needed. He also reports a chronic cough, especially in the mornings, sometimes producing dark brown sputum.   He has a history of smoking, currently reduced to less than five cigarettes a day, primarily due to workplace restrictions. He is attempting to quit smoking on his own without the use of nicotine  replacement therapies. No shortness of breath beyond his baseline, which he attributes to smoking. No history of coughing up blood, except for occasional dark brown sputum in the mornings.   He experiences a bad ringing in his right ear, describing it as a crushing, crumbling sound. No recent ear infections or earwax buildup. 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    Records Reviewed:  Cardiology Office Visit 05/21/22 HERMANN DOTTAVIO is a 53 y.o. male with a hx of CAD, tobacco use who presents for 6 months follow-up.    The patient has a history of CAD with chest pain. Prior exercise MPI was negative for ischemia, LVEF 65%.    Last seen 09/2021 reporting chest pain and palpitations. Heart monitor 10/2021 showed underlying NSR  with average HR 78bpm, rare SVT, PACs/PVCs, no significant arrhythmias. Cardiac CTA 10/2021 showed mild non-calcified stenosis of the pRCA, coronary calcium  score of 0, small L>R PFO suspected.    Today, the patient is overall doing well. Patient has very rare sharp chest pain, very brief. Also has occasional dizziness and lightheadedness. He has occasional palpitations, mostly when he is angry. BP a little high today. At home BP is good. Patient does no activity. Diet is not good as he is a naval architect.  Non-obstructive CAD Prior heart monitor and Cardiac CT in 2023 were unremarkable (calcium  score of 0, mild non obstructive CAD). He reports very rare chest pain episodes. Palpitations mostly occur when he is angry. 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 Diet is also poor and he is a smoker. Lifestyle changes dicussed in detail today. BP is mildly elevated, but at home he reports it's 120s/80s. He is not on BP meds. He will continue to check BP at home. I will get general labs today, CBC, BMET, lipid panel.    Disposition: Follow up in 6 month(s) with MD/APP     Past Medical History:  Diagnosis Date   Depression    Dyskinesia    GERD (gastroesophageal reflux disease)    Hyperlipidemia    Hypertriglyceridemia     Past Surgical History:  Procedure Laterality Date   arm surgery     KNEE CARTILAGE SURGERY Right 03/17/2001    Family History  Problem Relation Age of Onset   CAD Other  uncle - died of PE   Heart attack Other    Cancer Sister    Cancer Maternal Grandmother    Seizures Maternal Grandmother    Heart attack Maternal Grandfather     Social History:  reports that he has been smoking cigarettes. He started smoking about 39 years ago. He has a 39.4 pack-year smoking history. He quit smokeless tobacco use about 37 years ago.  His smokeless tobacco use included chew. He reports that he does not drink alcohol and does not use drugs.  Allergies:  Allergies   Allergen Reactions   Bee Venom Swelling    Medications: I have reviewed the patient's current medications.  The PMH, PSH, Medications, Allergies, and SH were reviewed and updated.  ROS: Constitutional: Negative for fever, weight loss and weight gain. Cardiovascular: Negative for chest pain and dyspnea on exertion. Respiratory: Is not experiencing shortness of breath at rest. Gastrointestinal: Negative for nausea and vomiting. Neurological: Negative for headaches. Psychiatric: The patient is not nervous/anxious  Blood pressure 120/82, pulse 72, height 5' 11 (1.803 m), weight 163 lb (73.9 kg), SpO2 96%.  PHYSICAL EXAM:  Exam: General: Well-developed, well-nourished Communication and Voice: raspy Respiratory Respiratory effort: Equal inspiration and expiration without stridor Cardiovascular Peripheral Vascular: Warm extremities with equal color/perfusion Eyes: No nystagmus with equal extraocular motion bilaterally Neuro/Psych/Balance: Patient oriented to person, place, and time; Appropriate mood and affect; Gait is intact with no imbalance; Cranial nerves I-XII are intact Head and Face Inspection: Normocephalic and atraumatic without mass or lesion Palpation: Facial skeleton intact without bony stepoffs Salivary Glands: No mass or tenderness Facial Strength: Facial motility symmetric and full bilaterally ENT Pinna: External ear intact and fully developed External canal: Canal is patent with intact skin Tympanic Membrane: Clear and mobile External Nose: No scar or anatomic deformity Internal Nose: Septum is deviated to the left. No polyp, or purulence. Mucosal edema and erythema present.  Bilateral inferior turbinate hypertrophy.  Lips, Teeth, and gums: Mucosa and teeth intact and viable TMJ: No pain to palpation with full mobility Oral cavity/oropharynx: No erythema or exudate, no lesions present Nasopharynx: No mass or lesion with intact mucosa Hypopharynx: Intact mucosa  without pooling of secretions Larynx Glottic: Full true vocal cord mobility without lesion or mass Supraglottic: Normal appearing epiglottis and AE folds Interarytenoid Space: Moderate pachydermia&edema Subglottic Space: Patent without lesion or edema Neck Neck and Trachea: Midline trachea without mass or lesion Thyroid: No mass or nodularity Lymphatics: No lymphadenopathy  Procedure:  Preoperative diagnosis: chronic dysphonia and hx of smoking  Postoperative diagnosis:   Same + GERD LPR  Procedure: Flexible fiberoptic laryngoscopy  Surgeon: Elena Larry, MD  Anesthesia: Topical lidocaine  and Afrin Complications: None Condition is stable throughout exam  Indications and consent:  The patient presents to the clinic with Indirect laryngoscopy view was incomplete. Thus it was recommended that they undergo a flexible fiberoptic laryngoscopy. All of the risks, benefits, and potential complications were reviewed with the patient preoperatively and verbal informed consent was obtained.  Procedure: The patient was seated upright in the clinic. Topical lidocaine  and Afrin were applied to the nasal cavity. After adequate anesthesia had occurred, I then proceeded to pass the flexible telescope into the nasal cavity. The nasal cavity was patent without rhinorrhea or polyp. The nasopharynx was also patent without mass or lesion. The base of tongue was visualized and was normal. There were no signs of pooling of secretions in the piriform sinuses. The true vocal folds were mobile bilaterally. There were no  signs of glottic or supraglottic mucosal lesion or mass. There was moderate interarytenoid pachydermia and post cricoid edema. The telescope was then slowly withdrawn and the patient tolerated the procedure throughout.   PROCEDURE NOTE: nasal endoscopy  Preoperative diagnosis: chronic nasal congestion and productive cough symptoms  Postoperative diagnosis: same  Procedure: Diagnostic  nasal endoscopy (68768)  Surgeon: Elena Larry, M.D.  Anesthesia: Topical lidocaine  and Afrin  H&P REVIEW: The patient's history and physical were reviewed today prior to procedure. All medications were reviewed and updated as well. Complications: None Condition is stable throughout exam Indications and consent: The patient presents with symptoms of chronic sinusitis not responding to previous therapies. All the risks, benefits, and potential complications were reviewed with the patient preoperatively and informed consent was obtained. The time out was completed with confirmation of the correct procedure.   Procedure: The patient was seated upright in the clinic. Topical lidocaine  and Afrin were applied to the nasal cavity. After adequate anesthesia had occurred, the rigid nasal endoscope was passed into the nasal cavity. The nasal mucosa, turbinates, septum, and sinus drainage pathways were visualized bilaterally. This revealed minimal thick mucus on the left side in middle meatus, no purulence, no significant secretions that might be cultured. There were no polyps or sites of significant inflammation. The mucosa was intact and there was no crusting present. The scope was then slowly withdrawn and the patient tolerated the procedure well. There were no complications or blood loss.  Studies Reviewed: CT Angio chest 10-23-16 FINDINGS: Cardiovascular: Satisfactory opacification of pulmonary arteries noted, and no pulmonary emboli identified. No evidence of thoracic aortic dissection or aneurysm.   Mediastinum/Nodes: No masses or pathologically enlarged lymph nodes identified.   Lungs/Pleura: No pulmonary mass, infiltrate, or effusion.   Upper abdomen: No acute findings.   Musculoskeletal: No suspicious bone lesions identified.   Review of the MIP images confirms the above findings.   IMPRESSION: Negative. No evidence of pulmonary embolism or other active  disease.   Assessment/Plan: Encounter Diagnoses  Name Primary?   Tobacco use [Z72.0]    Tinnitus of both ears    Dysphonia Yes   Chronic GERD    Chronic nasal congestion    Productive cough    Post-nasal drip    Environmental and seasonal allergies    Nasal septal deviation    Hypertrophy of both inferior nasal turbinates     Assessment and Plan    Chronic Hoarseness Hoarseness and loss of voice after a cold in November, 2024. Symptoms persisted for over two months. No pain when talking, occasional dysphagia with bread otherwise on issues with swallowing. Reduced smoking to <5 cigarettes/day, but has significant smoking hx. Physical exam and scope exams showed no tumors or masses, normal movement of b/l VF, but postnasal drainage and vocal cord irritation/erythema very mild edema, likely due to reflux and throat clearing/coughing vs hx of smoking (could be mild Reinke's changes(. Discussed that addressing reflux and postnasal drainage may improve symptoms. Potential need for video strobe exam if symptoms persist. - Recommend nasal rinses with saline (Neti pot or Rosalynn Med) twice daily - Start Zyrtec  10 mg at night - Start Flonase  2 puffs b/l nares twice daily - management f GERD LPR - videostrobe when he returns   Chronic GERD LPR Managed with Tums. Scope exam showed post-cricoid edema c/w GERD LPR. Explained reflux can cause vocal cord irritation and hoarseness. Discussed benefits of starting reflux medication and supplement. - Prescribe reflux medication Pepcid  20 mg twice daily -  Recommend Reflux Gourmet supplement after meals - diet and lifestyle changes to minimize GERD  Chronic nasal congestion and suspected environmental allergies  Nasal endoscopy with septal deviation/inferior turbinate hypertrophy and post-nasal drainage. Could be contributing to productive cough - Recommend nasal rinses with saline (Neti pot or Rosalynn Med) twice daily - Start Zyrtec  10 mg at night - Start  Flonase  2 puffs b/l nares twice daily   Tinnitus R > L Ringing in right > left ear, described as crumbling sound. No earwax or infection on exam. Likely related to hearing loss, potentially exacerbated by occupational noise exposure. Explained tinnitus often indicates hearing loss, confirmed with hearing test. - Order hearing test   Smoking Cessation Reduced smoking to <5 cigarettes/day. Emphasized importance of complete smoking cessation to reduce symptoms and improve overall health. Encouraged continued reduction. Spent 4 min counseling - Encourage continued reduction in smoking - Discuss benefits of complete smoking cessation   Follow-up - Schedule follow-up appointment in a few weeks for repeat scope exam.     Thank you for allowing me to participate in the care of this patient. Please do not hesitate to contact me with any questions or concerns.   Elena Larry, MD Otolaryngology [facility] Phone: [phone] Fax: [phone]    06/13/2023, 5:50 PM

## 2023-06-13 NOTE — Patient Instructions (Addendum)
 Dear Jonathan Meyer,   Congratulations for your interest in quitting smoking!  Find a program that suits you best: when you want to quit, how you need support, where you live, and how you like to learn.    If you're ready to get started TODAY, consider scheduling a visit through [email or web address].  Appointments are available from 8am to 8pm, Monday to Friday.   Most health insurance plans will cover some level of tobacco cessation visits and medications.    Additional Resources: [employer] are also available to help you quit & provide the support you'll need. Many programs are available in both English and Spanish and have a long history of successfully helping people get off and stay off tobacco.    Quit Smoking Apps:  quitSTART at seriousbroker.de QuitGuide?at forgetparking.dk Online education and resources: Smokefree  at borders group.gov Free Telephone Coaching: QuitNow,  Call 1-800-QUIT-NOW (857-449-4115) or Text- Ready to 617-134-7519 *Quitline [place] has teamed up with Medicaid to offer a free 14 week program    Vaping- Want to Quit? Free 24/7 support. Call [facility]  [place], [place], [place], [place], [place]  [place]   - start nasal saline rinses with NeilMed Bottle available over the counter or online to help with nasal congestion    gaminglesson.nl - check out this website to learn more about reflux   -Avoid lying down for at least two hours after a meal or after drinking acidic beverages, like soda, or other caffeinated beverages. This can help to prevent stomach contents from flowing back into the esophagus. -Keep your head elevated while you sleep. Using an extra pillow or two can also help to prevent reflux. -Eat smaller and more frequent meals each day instead of a few large meals. This promotes digestion and can aid in preventing heartburn. -Wear loose-fitting clothes to ease  pressure on the stomach, which can worsen heartburn and reflux. -Reduce excess weight around the midsection. This can ease pressure on the stomach. Such pressure can force some stomach contents back up the esophagus - Take Reflux Gourmet (natural supplement available on Amazon) to help with symptoms of chronic throat irritation

## 2023-07-24 ENCOUNTER — Ambulatory Visit: Payer: BC Managed Care – PPO | Admitting: Audiologist

## 2023-12-04 ENCOUNTER — Ambulatory Visit: Admitting: Cardiology

## 2023-12-05 ENCOUNTER — Ambulatory Visit: Admitting: Cardiology

## 2023-12-17 ENCOUNTER — Telehealth: Payer: Self-pay | Admitting: Cardiology

## 2023-12-17 NOTE — Telephone Encounter (Signed)
 Called pt to r/s appt that was cancelled several times, recall still in. PT does not want to schedule at this time. He stated he will c/b. I have deleted recall.

## 2024-05-05 ENCOUNTER — Telehealth: Payer: Self-pay | Admitting: Cardiology

## 2024-05-05 NOTE — Telephone Encounter (Signed)
 Pt states that over the last 4 months he has had chest pain and neck pain 4 times. He states that the pressure and pain comes and moves up to his neck. Denies nausea and vomiting. He does have cold sweats during these episodes. Each time he has stopped what he was doing and the pain went away within 2 mins. Pain is centered in the chest. Appt made for Friday at 8:50 am with E. Miriam. Please advise.

## 2024-05-05 NOTE — Telephone Encounter (Signed)
 Pt c/o of Chest Pain: STAT if active (IN THIS MOMENT) CP, including tightness, pressure, jaw pain, shoulder/upper arm/back pain, SOB, nausea, and vomiting.  1. Are you having CP right now (tightness, pressure, or discomfort)?  No, but when it occurs it is a crushing heaviness and pain that radiates up his neck. Last occurred a few days ago.   2. Are you experiencing any other symptoms (ex. SOB, nausea, vomiting, sweating)?  Lightheadedness with CP. No symptoms currently  3. How long have you been experiencing CP?  Occurred about 4 months ago or the first time  4. Is your CP continuous or coming and going?  Coming and going  5. Have you taken Nitroglycerin ?  No

## 2024-05-05 NOTE — Telephone Encounter (Signed)
 Called the pt to inform of Jonathan Meyer's response. Patient states that he is not currently having chest pain but will go to the ER if the pains come back.

## 2024-05-07 ENCOUNTER — Encounter: Payer: Self-pay | Admitting: Nurse Practitioner

## 2024-05-07 ENCOUNTER — Ambulatory Visit: Attending: Nurse Practitioner | Admitting: Nurse Practitioner

## 2024-05-07 VITALS — BP 102/78 | HR 83 | Ht 71.0 in | Wt 172.6 lb

## 2024-05-07 DIAGNOSIS — I25119 Atherosclerotic heart disease of native coronary artery with unspecified angina pectoris: Secondary | ICD-10-CM

## 2024-05-07 DIAGNOSIS — E781 Pure hyperglyceridemia: Secondary | ICD-10-CM

## 2024-05-07 DIAGNOSIS — R079 Chest pain, unspecified: Secondary | ICD-10-CM | POA: Diagnosis not present

## 2024-05-07 DIAGNOSIS — I10 Essential (primary) hypertension: Secondary | ICD-10-CM | POA: Diagnosis not present

## 2024-05-07 DIAGNOSIS — E785 Hyperlipidemia, unspecified: Secondary | ICD-10-CM | POA: Diagnosis not present

## 2024-05-07 DIAGNOSIS — Z720 Tobacco use: Secondary | ICD-10-CM

## 2024-05-07 MED ORDER — NITROGLYCERIN 0.4 MG SL SUBL: 0.4000 mg | SUBLINGUAL_TABLET | SUBLINGUAL | 2 refills | Status: AC | PRN

## 2024-05-07 NOTE — Progress Notes (Signed)
 " Cardiology Office Note   Date:  05/07/2024 ID:  Jonathan Meyer, DOB [id number], MRN [id number] PCP: Jonathan Elspeth BRAVO, MD  [facility] Providers Cardiologist:  Alvan Carrier, MD     History of Present Illness Jonathan Meyer is a 54 y.o. male with a PMH of chest pain, CAD, HTN, HLD, hypertriglyceridemia, palpitations, tobacco use, GERD, and depression, who presents today for chest pain evaluation.   Cardiac CT in 2023 were unremarkable, previous hear monitor in 2023 also unremarkable.   Last seen by Cadence Franchester RIGGERS on May 21, 2022. Was overall doing well at the time but admitted to very rare sharp chest pain, very brief. Also noted occasional dizziness/lightheadedness. Occasional palpitations when angry.   He recently contacted our office regarding chest pain. Sates over CP episodes 4 times over a 4 month span. Described as pressure/pain that comes and radiates to his neck, admitted to cold sweats during episodes. See most recent telephone note.   Here for chest pain evaluation. He states he has noticed a very rare occurrence of dull pain in his chest that doesn't seem to go away. More recent episode was concerning for him and felt like a crushing, sensation and radiated up his neck and down his legs, happened at rest. Noticed associated shortness of breath, did not last long. Did not try any medications to help. Denies any alleviating or aggravating factors. Did have an episode once while driving. Does smoke about 1.5 PPD. Works as a naval architect and admits to [employer] and mainly sedentary. Denies any palpitations, syncope, presyncope, dizziness, orthopnea, PND, swelling or significant weight changes, acute bleeding, or claudication.  SH: Works as a naval architect.  ROS: Negative. See HPI.   Studies Reviewed EKG Interpretation Date/Time:  Friday May 07 2024 08:59:54 EST Ventricular Rate:  83 PR Interval:  156 QRS Duration:  88 QT  Interval:  380 QTC Calculation: 446 R Axis:   90  Text Interpretation: Normal sinus rhythm Rightward axis No previous ECGs available Confirmed by Jonathan Meyer [phone]) on 05/07/2024 9:05:51 AM    Cardiac monitor 10/2021:  7 day monitor Rare supraventricular ectopy in the form of isolated PACs Rare ventricular ectopy in the form of isolated PVCs No symptoms reported No significant arrhythmias     Patch Wear Time:  7 days and 21 hours (2023-06-04T16:30:59-0400 to 2023-06-12T14:04:53-0400)   Patient had a min HR of 45 bpm, max HR of 144 bpm, and avg HR of 78 bpm. Predominant underlying rhythm was Sinus Rhythm. Isolated SVEs were rare (<1.0%), and no SVE Couplets or SVE Triplets were present. Isolated VEs were rare (<1.0%), and no VE Couplets  or VE Triplets were present.  CCTA 10/2021:  IMPRESSION: 1. Mild non-calcified stenosis of the proximal RCA, CADRADS = 2.   2. Coronary calcium  score of 0. This was 0 percentile for age and sex matched control.   3. Normal coronary origin with right dominance.   4. Aggressive cardiovascular risk reduction recommended given non-calcified coronary plaque.   5. Small left to right PFO suspected   6. Consider non-coronary causes of chest pain  Echo stress test 10/2016:  Study Conclusions   - Procedure narrative: Dobutamine  stress test. Stress testing was    performed, with dobutamine  infusion from 5 to 40 mcg/kg/min.    Atropine  0.5 mg x2 doses also required to accelerate heart rate    to target. Heart rate response was augmented by the addition of    hand grips. The infusion  was terminated due to target heart rate    achievement.  - Stress ECG conclusions: No diagnostic ST segment changes by    standard criteria to indicate ischemia. T-wave inversion improved    and rapid heart rate with return in recovery in the anterolateral    leads. Occasional PVCs were noted without sustained arrhythmia.    The stress ECG was negative for ischemia. No  chest pain reported.  - Staged echo: There was no echocardiographic evidence for    stress-induced ischemia.  Lexiscan  12/2013:  IMPRESSION:  1. No reversible ischemia or infarction.   2. Normal left ventricular wall motion.   3. Left ventricular ejection fraction 65%   4. Low-risk stress test findings*.   Physical Exam VS:  BP 102/78 (BP Location: Left Arm)   Pulse 83   Ht 5' 11 (1.803 m)   Wt 172 lb 9.6 oz (78.3 kg)   SpO2 97%   BMI 24.07 kg/m        Wt Readings from Last 3 Encounters:  05/07/24 172 lb 9.6 oz (78.3 kg)  06/13/23 163 lb (73.9 kg)  08/02/22 173 lb (78.5 kg)    GEN: Well nourished, well developed in no acute distress NECK: No JVD; No carotid bruits CARDIAC: S1/S2, RRR, no murmurs, rubs, gallops RESPIRATORY:  Clear to auscultation without rales, wheezing or rhonchi  ABDOMEN: Soft, non-tender, non-distended EXTREMITIES:  No edema; No deformity   ASSESSMENT AND PLAN  Chest pain of uncertain etiology, CAD Does admit to some atypical symptoms. Past cardiac testing has been benign, however has noticed more worrisome CP. EKG is reassuring today. Did discuss risks vs benefits of stress PET and he verbalized understanding and is agreeable to proceed. Will arrange Stress PET for more further evaluation. Will Rx NTG PRN for chest pain.  I educated him about this medication and verbalized understanding.  No other med changes at this time. Care and ED precautions discussed. Will obtain CBC and CMET.    Informed Consent   Shared Decision Making/Informed Consent The risks [chest pain, shortness of breath, cardiac arrhythmias, dizziness, blood pressure fluctuations, myocardial infarction, stroke/transient ischemic attack, nausea, vomiting, allergic reaction, radiation exposure, metallic taste sensation and life-threatening complications (estimated to be 1 in 10,000)], benefits (risk stratification, diagnosing coronary artery disease, treatment guidance) and alternatives  of a cardiac PET stress test were discussed in detail with Mr. Duve and he agrees to proceed.     HTN BP is stable. Discussed to monitor BP at home at least 2 hours after medications and sitting for 5-10 minutes.  No medication changes at this time. Heart healthy diet and regular cardiovascular exercise encouraged.   HLD, hypertriglyceridemia He is due for labs and will obtain lipid panel and CMET. Heart healthy diet and regular cardiovascular exercise encouraged.   Tobacco abuse Smoking cessation encouraged and discussed.       Dispo: Follow-up with MD/APP pending results.  Signed, Almarie Crate, NP   "

## 2024-05-07 NOTE — Patient Instructions (Addendum)
 Medication Instructions:  Your physician has recommended you make the following change in your medication:  Take Nitroglycerin  0.4 mg as needed for chest pain. Dissolve one under tongue for chest pain every 5 minutes up to 3 doses. If no relief, proceed to ED or call 911  Labwork: CBC, CMET and Fasting Lipid Panel to be completed at [facility] in a week  Testing/Procedures:    Please report to Radiology at the [facility] Main Entrance 30 minutes early for your test.  [address] [place], [place] [address]                         OR   Please report to Radiology at [facility] Main Entrance, medical mall, 30 mins prior to your test.  [address]  [place], [place]  How to Prepare for Your Cardiac PET/CT Stress Test:  Nothing to eat or drink, except water, 3 hours prior to arrival time.  NO caffeine/decaffeinated products, or chocolate 12 hours prior to arrival. (Please note decaffeinated beverages (teas/coffees) still contain caffeine).  If you have caffeine within 12 hours prior, the test will need to be rescheduled.  Medication instructions: Do not take erectile dysfunction medications for 72 hours prior to test (sildenafil, tadalafil) Do not take nitrates (isosorbide mononitrate, Ranexa) the day before or day of test Do not take tamsulosin  the day before or morning of test Hold theophylline containing medications for 12 hours. Hold Dipyridamole 48 hours prior to the test.  Diabetic Preparation: If able to eat breakfast prior to 3 hour fasting, you may take all medications, including your insulin. Do not worry if you miss your breakfast dose of insulin - start at your next meal. If you do not eat prior to 3 hour fast-Hold all diabetes (oral and insulin) medications. Patients who wear a continuous glucose monitor MUST remove the device prior to scanning.  You may take your remaining medications with water.  NO perfume, cologne or lotion  on chest or abdomen area. FEMALES - Please avoid wearing dresses to this appointment.  Total time is 1 to 2 hours; you may want to bring reading material for the waiting time.  IF YOU THINK YOU MAY BE PREGNANT, OR ARE NURSING PLEASE INFORM THE TECHNOLOGIST.  In preparation for your appointment, medication and supplies will be purchased.  Appointment availability is limited, so if you need to cancel or reschedule, please call the Radiology Department Scheduler at [phone] 24 hours in advance to avoid a cancellation fee of $100.00  What to Expect When you Arrive:  Once you arrive and check in for your appointment, you will be taken to a preparation room within the Radiology Department.  A technologist or Nurse will obtain your medical history, verify that you are correctly prepped for the exam, and explain the procedure.  Afterwards, an IV will be started in your arm and electrodes will be placed on your skin for EKG monitoring during the stress portion of the exam. Then you will be escorted to the PET/CT scanner.  There, staff will get you positioned on the scanner and obtain a blood pressure and EKG.  During the exam, you will continue to be connected to the EKG and blood pressure machines.  A small, safe amount of a radioactive tracer will be injected in your IV to obtain a series of pictures of your heart along with an injection of a stress agent.    After  your Exam:  It is recommended that you eat a meal and drink a caffeinated beverage to counter act any effects of the stress agent.  Drink plenty of fluids for the remainder of the day and urinate frequently for the first couple of hours after the exam.  Your doctor will inform you of your test results within 7-10 business days.  For more information and frequently asked questions, please visit our website: [email or web address]  For questions about your test or how to prepare for your test, please call: Cardiac Imaging Nurse  [facility] Office: [phone]   Follow-Up: Your physician recommends that you schedule a follow-up appointment in: Pending Results  Any Other Special Instructions Will Be Listed Below (If Applicable). Thank you for choosing  [facility]!     If you need a refill on your cardiac medications before your next appointment, please call your pharmacy.

## 2024-05-13 LAB — COMPREHENSIVE METABOLIC PANEL WITH GFR
ALT: 15 IU/L (ref 0–44)
AST: 10 IU/L (ref 0–40)
Albumin: 4.5 g/dL (ref 3.8–4.9)
Alkaline Phosphatase: 51 IU/L (ref 47–123)
BUN/Creatinine Ratio: 11 (ref 9–20)
BUN: 12 mg/dL (ref 6–24)
Bilirubin Total: 0.9 mg/dL (ref 0.0–1.2)
CO2: 23 mmol/L (ref 20–29)
Calcium: 9 mg/dL (ref 8.7–10.2)
Chloride: 105 mmol/L (ref 96–106)
Creatinine, Ser: 1.07 mg/dL (ref 0.76–1.27)
Globulin, Total: 2.3 g/dL (ref 1.5–4.5)
Glucose: 92 mg/dL (ref 70–99)
Potassium: 4.6 mmol/L (ref 3.5–5.2)
Sodium: 142 mmol/L (ref 134–144)
Total Protein: 6.8 g/dL (ref 6.0–8.5)
eGFR: 83 mL/min/1.73

## 2024-05-13 LAB — CBC
Hematocrit: 46.3 % (ref 37.5–51.0)
Hemoglobin: 14.7 g/dL (ref 13.0–17.7)
MCH: 28.3 pg (ref 26.6–33.0)
MCHC: 31.7 g/dL (ref 31.5–35.7)
MCV: 89 fL (ref 79–97)
Platelets: 239 x10E3/uL (ref 150–450)
RBC: 5.2 x10E6/uL (ref 4.14–5.80)
RDW: 13 % (ref 11.6–15.4)
WBC: 6.2 x10E3/uL (ref 3.4–10.8)

## 2024-05-13 LAB — LIPID PANEL
Chol/HDL Ratio: 7.7 ratio — ABNORMAL HIGH (ref 0.0–5.0)
Cholesterol, Total: 240 mg/dL — ABNORMAL HIGH (ref 100–199)
HDL: 31 mg/dL — ABNORMAL LOW
LDL Chol Calc (NIH): 158 mg/dL — ABNORMAL HIGH (ref 0–99)
Triglycerides: 270 mg/dL — ABNORMAL HIGH (ref 0–149)
VLDL Cholesterol Cal: 51 mg/dL — ABNORMAL HIGH (ref 5–40)

## 2024-05-21 ENCOUNTER — Ambulatory Visit: Payer: Self-pay | Admitting: Nurse Practitioner

## 2024-05-21 DIAGNOSIS — E781 Pure hyperglyceridemia: Secondary | ICD-10-CM

## 2024-05-21 DIAGNOSIS — E785 Hyperlipidemia, unspecified: Secondary | ICD-10-CM

## 2024-05-25 ENCOUNTER — Other Ambulatory Visit: Payer: Self-pay

## 2024-05-25 DIAGNOSIS — E785 Hyperlipidemia, unspecified: Secondary | ICD-10-CM

## 2024-05-25 DIAGNOSIS — E781 Pure hyperglyceridemia: Secondary | ICD-10-CM

## 2024-05-25 MED ORDER — ROSUVASTATIN CALCIUM 5 MG PO TABS: 5.0000 mg | ORAL_TABLET | Freq: Every day | ORAL | 3 refills | Status: AC

## 2024-05-25 NOTE — Telephone Encounter (Signed)
-----   Message from Almarie Crate, NP sent at 05/21/2024  4:07 PM EST ----- Cholesterol is quite elevated.  To help lower his future risk of heart attack or stroke, I recommend starting Crestor  5 mg daily and repeating FLP/LFT for diagnosis of mixed hyperlipidemia.   Thanks!  Almarie Crate, AGNP-C

## 2024-05-31 ENCOUNTER — Encounter (HOSPITAL_COMMUNITY): Payer: Self-pay

## 2024-06-02 ENCOUNTER — Encounter (HOSPITAL_COMMUNITY)
Admission: RE | Admit: 2024-06-02 | Discharge: 2024-06-02 | Disposition: A | Discharge status: Home / Self Care | Source: Ambulatory Visit | Attending: Nurse Practitioner | Admitting: Nurse Practitioner

## 2024-06-02 DIAGNOSIS — R079 Chest pain, unspecified: Secondary | ICD-10-CM | POA: Diagnosis not present

## 2024-06-02 LAB — NM PET CT CARDIAC PERFUSION MULTI W/ABSOLUTE BLOODFLOW
MBFR: 3.95
Nuc Rest EF: 54 %
Nuc Stress EF: 67 %
Peak HR: 100 {beats}/min
Rest HR: 70 {beats}/min
Rest MBF: 0.59 ml/g/min
Rest Nuclear Isotope Dose: 20.5 mCi
ST Depression (mm): 0 mm
Stress MBF: 2.33 ml/g/min
Stress Nuclear Isotope Dose: 20.2 mCi
TID: 0.94

## 2024-06-02 MED ORDER — REGADENOSON 0.4 MG/5ML IV SOLN
INTRAVENOUS | Status: AC
  Filled 2024-06-02: qty 5

## 2024-06-02 MED ORDER — REGADENOSON 0.4 MG/5ML IV SOLN
0.4000 mg | Freq: Once | INTRAVENOUS | Status: AC
  Administered 2024-06-02: 0.4 mg via INTRAVENOUS

## 2024-06-02 MED ORDER — RUBIDIUM RB82 GENERATOR (RUBYFILL)
20.1600 | PACK | Freq: Once | INTRAVENOUS | Status: AC
  Administered 2024-06-02: 20.16 via INTRAVENOUS

## 2024-06-02 MED ORDER — RUBIDIUM RB82 GENERATOR (RUBYFILL)
20.4900 | PACK | Freq: Once | INTRAVENOUS | Status: AC
  Administered 2024-06-02: 20.49 via INTRAVENOUS
